# Patient Record
Sex: Female | Born: 1990
Health system: Southern US, Community
[De-identification: ages and names within clinical notes are randomized; demographics above are authoritative.]

## PROBLEM LIST (undated history)

## (undated) DIAGNOSIS — Z803 Family history of malignant neoplasm of breast: Secondary | ICD-10-CM

## (undated) DIAGNOSIS — C801 Malignant (primary) neoplasm, unspecified: Secondary | ICD-10-CM

## (undated) DIAGNOSIS — R51 Headache: Secondary | ICD-10-CM

## (undated) DIAGNOSIS — R112 Nausea with vomiting, unspecified: Secondary | ICD-10-CM

## (undated) DIAGNOSIS — Z808 Family history of malignant neoplasm of other organs or systems: Secondary | ICD-10-CM

## (undated) DIAGNOSIS — B009 Herpesviral infection, unspecified: Secondary | ICD-10-CM

## (undated) DIAGNOSIS — K219 Gastro-esophageal reflux disease without esophagitis: Secondary | ICD-10-CM

## (undated) DIAGNOSIS — E3122 Multiple endocrine neoplasia [MEN] type IIA: Secondary | ICD-10-CM

## (undated) DIAGNOSIS — Z9889 Other specified postprocedural states: Secondary | ICD-10-CM

## (undated) DIAGNOSIS — Z800 Family history of malignant neoplasm of digestive organs: Secondary | ICD-10-CM

## (undated) HISTORY — DX: Family history of malignant neoplasm of digestive organs: Z80.0

## (undated) HISTORY — DX: Family history of malignant neoplasm of breast: Z80.3

## (undated) HISTORY — DX: Family history of malignant neoplasm of other organs or systems: Z80.8

## (undated) HISTORY — DX: Multiple endocrine neoplasia (MEN) type IIA: E31.22

## (undated) HISTORY — PX: THYROIDECTOMY: SHX17

## (undated) HISTORY — PX: SHOULDER SURGERY: SHX246

---
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
## 2009-05-19 ENCOUNTER — Ambulatory Visit (HOSPITAL_COMMUNITY): Admission: RE | Admit: 2009-05-19 | Discharge: 2009-05-19 | Payer: Self-pay | Admitting: Orthopedic Surgery

## 2009-09-08 ENCOUNTER — Ambulatory Visit (HOSPITAL_BASED_OUTPATIENT_CLINIC_OR_DEPARTMENT_OTHER): Admission: RE | Admit: 2009-09-08 | Discharge: 2009-09-08 | Payer: Self-pay | Admitting: Orthopedic Surgery

## 2009-11-09 ENCOUNTER — Emergency Department (HOSPITAL_COMMUNITY): Admission: EM | Admit: 2009-11-09 | Discharge: 2009-11-09 | Payer: Self-pay | Admitting: Emergency Medicine

## 2010-04-01 ENCOUNTER — Emergency Department (HOSPITAL_COMMUNITY): Admission: EM | Admit: 2010-04-01 | Discharge: 2010-04-01 | Payer: Self-pay | Admitting: Family Medicine

## 2010-07-17 ENCOUNTER — Ambulatory Visit (HOSPITAL_COMMUNITY)
Admission: RE | Admit: 2010-07-17 | Discharge: 2010-07-17 | Payer: Self-pay | Source: Home / Self Care | Attending: Emergency Medicine | Admitting: Emergency Medicine

## 2010-09-19 LAB — URINALYSIS, ROUTINE W REFLEX MICROSCOPIC
Bilirubin Urine: NEGATIVE
Ketones, ur: 15 mg/dL — AB
pH: 6 (ref 5.0–8.0)

## 2010-09-19 LAB — URINE CULTURE
Colony Count: NO GROWTH
Culture: NO GROWTH

## 2010-09-19 LAB — GLUCOSE, CAPILLARY: Glucose-Capillary: 120 mg/dL — ABNORMAL HIGH (ref 70–99)

## 2010-09-19 LAB — URINE MICROSCOPIC-ADD ON

## 2010-11-17 NOTE — Op Note (Signed)
NAMETARAYA, STEWARD                 ACCOUNT NO.:  [id number]   MEDICAL RECORD NO.:  [id number]          PATIENT TYPE:  OIB   LOCATION:  6126                         FACILITY:  [facility]   PHYSICIAN:  Velora Heckler, MD      DATE OF BIRTH:  Apr 24, 1991   DATE OF PROCEDURE:  01/08/2006  DATE OF DISCHARGE:                                 OPERATIVE REPORT   PREOPERATIVE DIAGNOSIS:  Multiple endocrine neoplasia (MEN) 2A syndrome.   POSTOPERATIVE DIAGNOSIS:  Multiple endocrine neoplasia (MEN) 2A syndrome.   PROCEDURE:  1.  Total thyroidectomy.  2.  Limited central compartment lymph node dissection.   SURGEON:  Velora Heckler, MD, FACS   ASSISTANT:  Gita Kudo, MD, FACS   ANESTHESIA:  General per Dr. Kipp Brood.   ESTIMATED BLOOD LOSS:  Minimal.   PREPARATION:  Betadine.   COMPLICATIONS:  None.   INDICATIONS:  The patient is a 20 year old white female from [place], [place], who comes from a family genetically tested positive  for the MEN-2A syndrome.  Four other family members have undergone  thyroidectomy and been found to have medullary thyroid carcinoma.  The RET  proto-oncogene mutation is present in this patient.  Serum calcitonin level  is normal at 2.5.  The patient now comes for surgery for prophylactic  thyroidectomy.   The procedure is done in OR #3 at the [facility].  The  patient is brought to the operating room, placed in the supine position on  the operating room table.  Following administration of general anesthesia,  the patient is positioned and then prepped and draped in the usual strict  aseptic fashion.  After ascertaining that an adequate level of anesthesia  had been obtained, a Kocher incision was made with a #15 blade.  Dissection  is carried through subcutaneous tissues and platysma.  Hemostasis is  obtained with electrocautery.  Skin flaps are elevated cephalad and caudad.  A Mahorner self-retaining retractor is  placed for exposure.  Strap muscles  are incised in the midline and dissection is begun on the left side of the  neck.  Left thyroid lobe is exposed.  It appears grossly normal.  Venous  tributaries are divided between small ligaclips.  Superior pole vessels are  dissected out, ligated in continuity with 2-0 silk ties and medium ligaclips  and divided.  A nodule of tissue on the capsule of the gland near the  superior pole is identified.  It is excised.  It is biopsied and proves to  be parathyroid tissue.  This represents the left superior parathyroid gland.  The remaining tissue is reimplanted in the left sternocleidomastoid muscle  and secured with a 3-0 Vicryl suture ligature.   Continuing, the left thyroid lobe is mobilized.  Inferior venous tributaries  are divided between ligaclips.  Inferior thyroid artery is identified and  divided between ligaclips.  Recurrent laryngeal nerve is identified and  preserved.  Inferior parathyroid gland on the left is also identified and  preserved on its vascular pedicle.  Gland is rolled medially.  Ligament of  Allyson Sabal is transected with the electrocautery, and the gland is rolled up and  onto the anterior trachea.  There is a small pyramidal lobe which is  carefully dissected out with the electrocautery.   Next, we turned our attention to the right thyroid lobe.  Again, strap  muscles are reflected laterally.  Middle thyroid vein is divided between  small ligaclips.  Superior pole is dissected out.  Superior pole vessels are  ligated in continuity with 2-0 silk ties and medium ligaclips and divided.  Superior parathyroid gland is identified and preserved on its vascular  pedicle.  Inferior venous tributaries are divided between small ligaclips.  Inferior parathyroid gland is identified on the capsule of the thyroid and  gently dissected off on its vascular pedicle and preserved.  Inferior  thyroid artery is divided between ligaclips.  Recurrent  nerve is identified  and preserved.  Dissection is carried medially.  Thyroid gland is mobilized  off the trachea using the electrocautery for hemostasis.  Ligament of Allyson Sabal  is transected with the electrocautery and the gland is completely excised  off the anterior trachea.  A suture is used to mark the right superior pole  of the gland.  The entire gland is submitted to pathology for review.  Neck  is irrigated with warm saline.  Good hemostasis is obtained on both sides.   Next, the central compartment lymph nodes overlying the trachea are  dissected out using the electrocautery for hemostasis.  Several small lymph  nodes which appeared clinically benign are resected.  Dissection is carried  down to the level of the innominate vein.  Specimen is submitted to  pathology for review.  Good hemostasis is noted.  Surgicel is placed in the  central compartment.  Surgicel is placed over the recurrent nerves  bilaterally.  Strap muscles are reapproximated in the midline with  interrupted 3-0 Vicryl sutures.  Platysma is closed with interrupted 3-0  Vicryl sutures.  Skin is closed with a running 4-0 Vicryl subcuticular  suture.  Wound is washed and dried and Benzoin and Steri-Strips are applied.  Sterile dressings are applied.  The patient is awakened from anesthesia and  brought to the recovery room in stable condition.  The patient tolerated the  procedure well.      Velora Heckler, MD  Electronically Signed     TMG/MEDQ  D:  01/08/2006  T:  01/08/2006  Job:  [id number]   cc:   Dorisann Frames, M.D.  Fax: [phone]   Eliberto Ivory, M.D.  Fax: [phone]

## 2010-11-17 NOTE — Discharge Summary (Signed)
Nicole Farmer, Nicole Farmer                             ACCOUNT NO.:  [id number]   MEDICAL RECORD NO.:  [id number]                   PATIENT TYPE:  INP   LOCATION:  6148                                 FACILITY:  [facility]   PHYSICIAN:  Coletta Memos, M.D.                  DATE OF BIRTH:  September 01, 1990   DATE OF ADMISSION:  02/07/2004  DATE OF DISCHARGE:  02/08/2004                                 DISCHARGE SUMMARY   ADMISSION DIAGNOSIS:  Closed head injury.   INDICATIONS:  Nicole Farmer is a 20 year old who while at cheerleading  practice at the [place] fell hitting her head. She was confused and  not herself at the time of the injury. She was always alert. She did not  loose consciousness. There was no seizure activity. She was nauseated and  had three episodes of emesis prior to my seeing her in the emergency room in  the morning yesterday on February 07, 2004. Her neurological exam at the time  of admission was normal. Speech is clear and fluent. Pupils are equal,  round, and reactive to light. Full extraocular movements. At discharge, she  has normal neurological examination. A repeat head CT showed that there was  no evidence of a temporal lobe contusion which was questionable on the  initial scan. I told her to refrain from any activities with a strong  likelihood of head injuries such as climbing in her cheerleading practice  and tumbling for approximately two months. She did not need any follow up in  the office. Her mother is an emergency room nurse and knows to contact me as  we have discussed if there are problems.   DISCHARGE MEDICATIONS:  None.   DISCHARGE DESTINATION:  To home with parents. There have been no  complications during her admission.                                                Coletta Memos, M.D.    KC/MEDQ  D:  02/08/2004  T:  02/08/2004  Job:  [id number]

## 2010-11-17 NOTE — H&P (Signed)
[name], Nicole Farmer                             ACCOUNT NO.:  [id number]   MEDICAL RECORD NO.:  [id number]                   PATIENT TYPE:  EMS   LOCATION:  MAJO                                 FACILITY:  [facility]   PHYSICIAN:  Nicole Farmer, M.D.                  DATE OF BIRTH:  08-Mar-1991   DATE OF ADMISSION:  02/07/2004  DATE OF DISCHARGE:                                HISTORY & PHYSICAL   CHIEF COMPLAINT:  Headache, nausea and vomiting, possible temporal lobe  contusion on the right.   INDICATIONS:  The patient is the daughter of Nicole Farmer who is an emergency  room nurse here at [facility].  She while at cheerleading practice  this morning took a fall, striking her head on the floor covered with a mat  though her mother describes the mats as not being that thick.  She was very  confused initially and has also had three episodes of emesis since the fall.  According to her Mom, she has been quite goofy since hitting her head and on  occasion sounds more like her daughter.  She was always alert.  She was  always able to follow commands.  She is amnestic for the event, however.   PAST MEDICAL HISTORY:  Excellent.   REVIEW OF SYMPTOMS:  She denies constitutional, ear, nose, throat, mouth,  cardiovascular, respiratory, gastrointestinal, genito-urinary, skin,  neurological, hematologic, allergic, endocrinologic and psychiatric  problems.   MEDICATIONS:  She takes no medications.   ALLERGIES:  1. CODEINE.   IMMUNIZATIONS:  Her pediatric immunizations are current.   SOCIAL HISTORY:  She lives with her mother and father.  She does attend  school when school is in session.  She has had any previous head injuries.   PHYSICAL EXAMINATION:  VITAL SIGNS:  Blood pressure is 113/54, pulse is 81  when it was last recorded, respiratory rate 20, 100% on pulse oximetry  saturation on breathing air.  HEENT:  Pupils equal, round and reactive to light.  Extraocular movements,  oral and  funduscopic exams are present.  She has normal visual fields.  She  has symmetric facial movements and symmetric facial sensation. Hearing is  intact to voice bilaterally.  Uvula elevates in the midline.  Shoulder shrug  is normal. Tongue protrudes in midline.  LUNGS:  The lung fields are clear.  There are no cervical masses or bruits.  EXTREMITIES: Pulses are good at the wrists and feet bilaterally.  CARDIOVASCULAR:  Heart is regular rate and rhythm with no murmurs or rubs  recorded.  NEUROLOGIC:  She does have some dried emesis on the bed next to her.  She  was mildly sleepy, but easily woke up for her exam.  She has normal strength  in the upper and lower extremities.  There is no drift.  There is intact  proprioception and intact reflexes.  Gait was  not assessed.   CT of the head is reviewed. This shows what might possibly be less than 1 mm  punctate lesion which is high density in the temporal lobe.  I am not at all  sure that this is real, but it is there. Otherwise, there are no skull  fractures and no epidural, subdural or subarachnoid hemorrhoids.  The basal  cisterns are widely patent.  The ventricles are patent.  There is normal  gray white differentiation.  No mass is appreciated.   DIAGNOSIS:  Mild closed head injury, Glasgow coma scale of 15.   PLAN:  Secondary to the continued emesis, I believe it would be best that  the patient be admitted overnight on the pediatric unit.  I explained this  to both her mother and father and to the patient.  She appears to be doing  well.  She will have a repeat head CT for tomorrow and will be observed on  the pediatric floor.                                                Nicole Farmer, M.D.    KC/MEDQ  D:  02/07/2004  T:  02/07/2004  Job:  [id number]

## 2011-06-13 ENCOUNTER — Emergency Department (HOSPITAL_COMMUNITY): Payer: 59

## 2011-06-13 ENCOUNTER — Emergency Department (HOSPITAL_COMMUNITY)
Admission: EM | Admit: 2011-06-13 | Discharge: 2011-06-13 | Disposition: A | Discharge status: Home / Self Care | Payer: 59 | Attending: Emergency Medicine | Admitting: Emergency Medicine

## 2011-06-13 DIAGNOSIS — J029 Acute pharyngitis, unspecified: Secondary | ICD-10-CM | POA: Insufficient documentation

## 2011-06-13 DIAGNOSIS — R05 Cough: Secondary | ICD-10-CM | POA: Insufficient documentation

## 2011-06-13 DIAGNOSIS — R059 Cough, unspecified: Secondary | ICD-10-CM | POA: Insufficient documentation

## 2011-06-13 DIAGNOSIS — R509 Fever, unspecified: Secondary | ICD-10-CM | POA: Insufficient documentation

## 2011-06-13 LAB — RAPID STREP SCREEN (MED CTR MEBANE ONLY): Streptococcus, Group A Screen (Direct): NEGATIVE

## 2011-06-13 MED ORDER — HYDROCODONE-HOMATROPINE 5-1.5 MG/5ML PO SYRP: 5.0000 mL | ORAL_SOLUTION | Freq: Four times a day (QID) | ORAL | Status: AC | PRN

## 2011-06-13 NOTE — ED Provider Notes (Signed)
History     CSN: [id number] Arrival date & time: 06/13/2011 12:10 PM   First MD Initiated Contact with Patient 06/13/11 1240      Chief Complaint  Patient presents with  . Fever    (Consider location/radiation/quality/duration/timing/severity/associated sxs/prior treatment) HPI Comments: Patient was evaluated by her school physician and was given a prednisone pack 4 her sore throat.  Now she states she's having fever and cough as well.  She states she has had some night sweats and chills.  Denies other complaints.  Patient is a 20 y.o. female presenting with fever and pharyngitis. The history is provided by the patient.  Fever Primary symptoms of the febrile illness include fever, fatigue, cough and wheezing. Primary symptoms do not include visual change, headaches, shortness of breath, abdominal pain, nausea, vomiting, diarrhea or rash. The current episode started 3 to 5 days ago. This is a new problem. The problem has not changed since onset. The cough began 3 to 5 days ago. The cough is non-productive.  Sore Throat This is a new problem. The current episode started in the past 7 days. The problem occurs constantly. The problem has been unchanged. Associated symptoms include chills, coughing, fatigue, a fever and a sore throat. Pertinent negatives include no abdominal pain, chest pain, congestion, diaphoresis, headaches, nausea, neck pain, numbness, rash, urinary symptoms, visual change, vomiting or weakness. The symptoms are aggravated by eating and coughing. Treatments tried: steroids  The treatment provided no relief.  Sore Throat This is a new problem. The current episode started in the past 7 days. The problem occurs constantly. The problem has been unchanged. Pertinent negatives include no chest pain, no abdominal pain, no headaches and no shortness of breath. The symptoms are aggravated by eating and coughing. Treatments tried: steroids  The treatment provided no relief.    No  past medical history on file.  No past surgical history on file.  No family history on file.  History  Substance Use Topics  . Smoking status: Not on file  . Smokeless tobacco: Not on file  . Alcohol Use: Not on file    OB History    No data available      Review of Systems  Constitutional: Positive for fever, chills, appetite change and fatigue. Negative for diaphoresis and activity change.  HENT: Positive for sore throat. Negative for congestion, facial swelling, rhinorrhea, sneezing, drooling, mouth sores, trouble swallowing, neck pain, neck stiffness, dental problem, voice change, postnasal drip and sinus pressure.   Eyes: Negative for visual disturbance.  Respiratory: Positive for cough and wheezing. Negative for choking, shortness of breath and stridor.   Cardiovascular: Negative for chest pain.  Gastrointestinal: Negative for nausea, vomiting, abdominal pain and diarrhea.  Skin: Negative for rash.  Neurological: Negative for dizziness, weakness, numbness and headaches.  Hematological: Positive for adenopathy. Does not bruise/bleed easily.  All other systems reviewed and are negative.    Allergies  Codeine  Home Medications   Current Outpatient Rx  Name Route Sig Dispense Refill  . IBUPROFEN 200 MG PO TABS Oral Take 600 mg by mouth every 6 (six) hours as needed. For pain     . LEVOTHYROXINE SODIUM 200 MCG PO TABS Oral Take 200 mcg by mouth daily.      Marland Kitchen LOESTRIN 24 FE PO Oral Take 1 tablet by mouth daily.      Marland Kitchen OMEPRAZOLE 20 MG PO CPDR Oral Take 20 mg by mouth daily.      Marland Kitchen PREDNISONE 20 MG  PO TABS Oral Take 40 mg by mouth daily. For 5 days     . VALACYCLOVIR HCL 1 G PO TABS Oral Take 500 mg by mouth daily.        BP 127/64  Pulse 79  Temp 99.3 F (37.4 C)  Resp 20  SpO2 98%  Physical Exam  Constitutional: She is oriented to person, place, and time. She appears well-developed and well-nourished. No distress.  HENT:  Head: Normocephalic and atraumatic.  No trismus in the jaw.  Right Ear: Tympanic membrane, external ear and ear canal normal.  Left Ear: Tympanic membrane, external ear and ear canal normal.  Nose: Rhinorrhea present. No sinus tenderness or nasal deformity. Right sinus exhibits no maxillary sinus tenderness and no frontal sinus tenderness. Left sinus exhibits no maxillary sinus tenderness and no frontal sinus tenderness.  Mouth/Throat: Uvula is midline, oropharynx is clear and moist and mucous membranes are normal. Normal dentition. No dental abscesses or uvula swelling. No oropharyngeal exudate, posterior oropharyngeal edema, posterior oropharyngeal erythema or tonsillar abscesses.  Neck: Trachea normal, normal range of motion and full passive range of motion without pain. Neck supple. No rigidity. Normal range of motion present. No Brudzinski's sign noted.  Cardiovascular: Normal rate and regular rhythm.   Pulmonary/Chest: Effort normal and breath sounds normal.  Abdominal: Soft.  Musculoskeletal: Normal range of motion.  Lymphadenopathy:       Head (right side): No preauricular and no posterior auricular adenopathy present.       Head (left side): No preauricular and no posterior auricular adenopathy present.    She has no cervical adenopathy.  Neurological: She is alert and oriented to person, place, and time.  Skin: Skin is warm and dry. She is not diaphoretic.  Psychiatric: She has a normal mood and affect.    ED Course  Procedures (including critical care time)   Labs Reviewed  RAPID STREP SCREEN   Dg Chest 2 View  06/13/2011  *RADIOLOGY REPORT*  Clinical Data: Cough.  Sore throat.  Congestion.  Fever.  CHEST - 2 VIEW  Comparison: None.  Findings: Heart size is normal.  Mediastinal shadows are normal. Lungs are clear.  No effusions.  Minimal spinal curvature.  IMPRESSION: No active cardiopulmonary disease.  Minimal spinal curvature.  Original Report Authenticated By: Thomasenia Sales, M.D.     No diagnosis  found. Patient chest x-ray and rapid strep were both negative.  Patient likely has a viral syndrome as a cause for her upper respiratory tract infection.  Patient will be given Hycodan for both pain relief and cough suppressant and instructed to gargle salt water.  Patient is to followup with her primary care Dr.   MDM  Sore throat, fever, cough   Medical screening examination/treatment/procedure(s) were performed by non-physician practitioner and as supervising physician I was immediately available for consultation/collaboration. Osvaldo Human, M.D.     [place], [place] 06/13/11 1416  Carleene Cooper III, MD 06/13/11 [phone]

## 2011-06-13 NOTE — ED Notes (Signed)
No change in condition, awaiting lab results

## 2011-06-13 NOTE — ED Notes (Signed)
Patient here with increasing sorethroat, fever. [facility] and started on prednisone-now having dizziness, fever, rash to upper extremities.

## 2011-07-17 ENCOUNTER — Emergency Department (HOSPITAL_BASED_OUTPATIENT_CLINIC_OR_DEPARTMENT_OTHER)
Admission: EM | Admit: 2011-07-17 | Discharge: 2011-07-17 | Disposition: A | Discharge status: Home / Self Care | Payer: 59 | Attending: Emergency Medicine | Admitting: Emergency Medicine

## 2011-07-17 ENCOUNTER — Emergency Department (INDEPENDENT_AMBULATORY_CARE_PROVIDER_SITE_OTHER): Payer: 59

## 2011-07-17 ENCOUNTER — Encounter (HOSPITAL_BASED_OUTPATIENT_CLINIC_OR_DEPARTMENT_OTHER): Payer: Self-pay | Admitting: Emergency Medicine

## 2011-07-17 DIAGNOSIS — R110 Nausea: Secondary | ICD-10-CM | POA: Insufficient documentation

## 2011-07-17 DIAGNOSIS — R1031 Right lower quadrant pain: Secondary | ICD-10-CM

## 2011-07-17 LAB — DIFFERENTIAL
Basophils Absolute: 0 10*3/uL (ref 0.0–0.1)
Basophils Relative: 0 % (ref 0–1)
Eosinophils Relative: 1 % (ref 0–5)
Monocytes Absolute: 0.7 10*3/uL (ref 0.1–1.0)
Monocytes Relative: 9 % (ref 3–12)

## 2011-07-17 LAB — COMPREHENSIVE METABOLIC PANEL
AST: 15 U/L (ref 0–37)
Albumin: 4.2 g/dL (ref 3.5–5.2)
BUN: 16 mg/dL (ref 6–23)
CO2: 23 mEq/L (ref 19–32)
Calcium: 9.4 mg/dL (ref 8.4–10.5)
Creatinine, Ser: 1 mg/dL (ref 0.50–1.10)
GFR calc non Af Amer: 80 mL/min — ABNORMAL LOW (ref >90)
Total Bilirubin: 0.3 mg/dL (ref 0.3–1.2)

## 2011-07-17 LAB — URINALYSIS, ROUTINE W REFLEX MICROSCOPIC
Bilirubin Urine: NEGATIVE
Hgb urine dipstick: NEGATIVE
Ketones, ur: NEGATIVE mg/dL
Protein, ur: 100 mg/dL — AB
Urobilinogen, UA: 0.2 mg/dL (ref 0.0–1.0)

## 2011-07-17 LAB — URINE MICROSCOPIC-ADD ON

## 2011-07-17 LAB — CBC
HCT: 39.1 % (ref 36.0–46.0)
Hemoglobin: 13.2 g/dL (ref 12.0–15.0)
MCHC: 33.8 g/dL (ref 30.0–36.0)
MCV: 89.5 fL (ref 78.0–100.0)
RDW: 12.6 % (ref 11.5–15.5)

## 2011-07-17 LAB — LIPASE, BLOOD: Lipase: 54 U/L (ref 11–59)

## 2011-07-17 MED ORDER — ONDANSETRON 4 MG PO TBDP: 4.0000 mg | ORAL_TABLET | Freq: Three times a day (TID) | ORAL | Status: AC | PRN

## 2011-07-17 MED ORDER — IOHEXOL 300 MG/ML  SOLN
100.0000 mL | Freq: Once | INTRAMUSCULAR | Status: AC | PRN
  Administered 2011-07-17: 100 mL via INTRAVENOUS

## 2011-07-17 MED ORDER — IOHEXOL 300 MG/ML  SOLN
20.0000 mL | Freq: Once | INTRAMUSCULAR | Status: AC | PRN
  Administered 2011-07-17: 20 mL via ORAL

## 2011-07-17 MED ORDER — HYDROCODONE-ACETAMINOPHEN 5-500 MG PO TABS
1.0000 | ORAL_TABLET | Freq: Four times a day (QID) | ORAL | Status: AC | PRN
Start: 2011-07-17 — End: 2011-07-27

## 2011-07-17 MED ORDER — AMOXICILLIN-POT CLAVULANATE 875-125 MG PO TABS: 1.0000 | ORAL_TABLET | Freq: Two times a day (BID) | ORAL | Status: DC

## 2011-07-17 NOTE — ED Notes (Signed)
Pt c/o RLQ pain x 3-4 days; some nausea today; denies vomiting/diarrhea

## 2011-07-17 NOTE — ED Provider Notes (Signed)
History     CSN: [id number]  Arrival date & time 07/17/11  1407   First MD Initiated Contact with Patient 07/17/11 1410      Chief Complaint  Patient presents with  . Abdominal Pain    (Consider location/radiation/quality/duration/timing/severity/associated sxs/prior treatment) HPI Comments: Pt states that she has had a low grade temperature  Patient is a 21 y.o. female presenting with abdominal pain. The history is provided by the patient. No language interpreter was used.  Abdominal Pain The primary symptoms of the illness include abdominal pain and nausea. The primary symptoms of the illness do not include vomiting, diarrhea, vaginal discharge or vaginal bleeding. The current episode started more than 2 days ago. The onset of the illness was gradual. The problem has not changed since onset. The patient states that she believes she is currently not pregnant. The patient has not had a change in bowel habit. Symptoms associated with the illness do not include chills, constipation, urgency, frequency or back pain.    History reviewed. No pertinent past medical history.  Past Surgical History  Procedure Date  . Thyroidectomy   . Shoulder surgery     RT    No family history on file.  History  Substance Use Topics  . Smoking status: Never Smoker   . Smokeless tobacco: Not on file  . Alcohol Use: Yes     social    OB History    Grav Para Term Preterm Abortions TAB SAB Ect Mult Living                  Review of Systems  Constitutional: Negative for chills.  Gastrointestinal: Positive for nausea and abdominal pain. Negative for vomiting, diarrhea and constipation.  Genitourinary: Negative for urgency, frequency, vaginal bleeding and vaginal discharge.  Musculoskeletal: Negative for back pain.  All other systems reviewed and are negative.    Allergies  Codeine  Home Medications   Current Outpatient Rx  Name Route Sig Dispense Refill  . IBUPROFEN 200 MG PO TABS  Oral Take 600 mg by mouth every 6 (six) hours as needed. For pain     . LEVOTHYROXINE SODIUM 200 MCG PO TABS Oral Take 200 mcg by mouth daily.      Marland Kitchen LOESTRIN 24 FE PO Oral Take 1 tablet by mouth daily.      Marland Kitchen OMEPRAZOLE 20 MG PO CPDR Oral Take 20 mg by mouth daily.      Marland Kitchen VALACYCLOVIR HCL 1 G PO TABS Oral Take 500 mg by mouth daily.        BP 121/79  Pulse 75  Temp(Src) 97.5 F (36.4 C) (Oral)  Resp 16  Wt 131 lb (59.421 kg)  SpO2 100%  LMP 06/16/2011  Physical Exam  Nursing note and vitals reviewed. Constitutional: She is oriented to person, place, and time. She appears well-developed and well-nourished.  HENT:  Head: Normocephalic and atraumatic.  Cardiovascular: Normal rate and regular rhythm.   Pulmonary/Chest: Effort normal and breath sounds normal.  Abdominal: Soft. Bowel sounds are normal. There is tenderness in the right lower quadrant.  Musculoskeletal: Normal range of motion.  Neurological: She is alert and oriented to person, place, and time.  Skin: Skin is warm and dry.  Psychiatric: She has a normal mood and affect.    ED Course  Procedures (including critical care time)  Labs Reviewed  URINALYSIS, ROUTINE W REFLEX MICROSCOPIC - Abnormal; Notable for the following:    Color, Urine AMBER (*) BIOCHEMICALS MAY BE  AFFECTED BY COLOR   Specific Gravity, Urine 1.034 (*)    Protein, ur 100 (*)    All other components within normal limits  COMPREHENSIVE METABOLIC PANEL - Abnormal; Notable for the following:    GFR calc non Af Amer 80 (*)    All other components within normal limits  PREGNANCY, URINE  CBC  DIFFERENTIAL  LIPASE, BLOOD  URINE MICROSCOPIC-ADD ON   Ct Abdomen Pelvis W Contrast  07/17/2011  *RADIOLOGY REPORT*  Clinical Data: Right lower quadrant abdominal pain.  Nausea.  CT ABDOMEN AND PELVIS WITH CONTRAST  Technique:  Multidetector CT imaging of the abdomen and pelvis was performed following the standard protocol during bolus administration of  intravenous contrast.  Contrast:  100  ml Omnipaque 300 IV contrast  Comparison: 07/17/2010  Findings: Lung bases are clear.  Liver, gallbladder, adrenal glands, kidneys, spleen, and pancreas are normal.  No lymphadenopathy.  No free air or free fluid.  Although the appendix is normal in caliber measuring 6 mm, there is minimal indistinctness of the wall and minimal apparent haziness of the surrounding fat.  No surrounding periappendiceal fluid collection or gas is identified.  No bowel wall thickening or focal segmental dilatation.  Uterus and ovaries are normal.  Trace pelvic free fluid is present.  No acute osseous finding.  IMPRESSION: Normal caliber appendix but with minimal indistinctness of the wall and surrounding haziness in the mesenteric fat, which could indicate very early appendicitis in the appropriate clinical context but is nonspecific.  No other acute intra-abdominal or pelvic pathology.  Original Report Authenticated By: Harrel Lemon, M.D.     1. Abdominal pain       MDM  Discussed the case with Dr. Magnus Ivan and we agreed that reasonable treatment would be antibiotics and follow up in the office or ed for continued or worsening symptoms        Teressa Lower, NP 07/17/11 1621

## 2011-07-18 NOTE — ED Provider Notes (Signed)
Medical screening examination/treatment/procedure(s) were performed by non-physician practitioner and as supervising physician I was immediately available for consultation/collaboration.   Manila Rommel A Catrina Fellenz, MD 07/18/11 0701 

## 2011-07-19 ENCOUNTER — Emergency Department (HOSPITAL_BASED_OUTPATIENT_CLINIC_OR_DEPARTMENT_OTHER)
Admission: EM | Admit: 2011-07-19 | Discharge: 2011-07-19 | Disposition: A | Discharge status: Home / Self Care | Payer: 59 | Attending: Emergency Medicine | Admitting: Emergency Medicine

## 2011-07-19 ENCOUNTER — Telehealth (INDEPENDENT_AMBULATORY_CARE_PROVIDER_SITE_OTHER): Payer: Self-pay

## 2011-07-19 ENCOUNTER — Emergency Department (INDEPENDENT_AMBULATORY_CARE_PROVIDER_SITE_OTHER): Payer: 59

## 2011-07-19 ENCOUNTER — Encounter (HOSPITAL_BASED_OUTPATIENT_CLINIC_OR_DEPARTMENT_OTHER): Payer: Self-pay

## 2011-07-19 DIAGNOSIS — N730 Acute parametritis and pelvic cellulitis: Secondary | ICD-10-CM

## 2011-07-19 DIAGNOSIS — Z79899 Other long term (current) drug therapy: Secondary | ICD-10-CM | POA: Insufficient documentation

## 2011-07-19 DIAGNOSIS — R1031 Right lower quadrant pain: Secondary | ICD-10-CM | POA: Insufficient documentation

## 2011-07-19 LAB — CBC
MCH: 29.9 pg (ref 26.0–34.0)
MCHC: 33.5 g/dL (ref 30.0–36.0)
MCV: 89.3 fL (ref 78.0–100.0)
Platelets: 205 10*3/uL (ref 150–400)
RBC: 4.41 MIL/uL (ref 3.87–5.11)
RDW: 12.5 % (ref 11.5–15.5)

## 2011-07-19 LAB — URINALYSIS, ROUTINE W REFLEX MICROSCOPIC
Glucose, UA: NEGATIVE mg/dL
Ketones, ur: NEGATIVE mg/dL
Leukocytes, UA: NEGATIVE
Nitrite: NEGATIVE
Protein, ur: NEGATIVE mg/dL
Urobilinogen, UA: 0.2 mg/dL (ref 0.0–1.0)

## 2011-07-19 LAB — DIFFERENTIAL
Basophils Absolute: 0 10*3/uL (ref 0.0–0.1)
Basophils Relative: 1 % (ref 0–1)
Eosinophils Absolute: 0.1 10*3/uL (ref 0.0–0.7)
Eosinophils Relative: 2 % (ref 0–5)
Lymphs Abs: 2.7 10*3/uL (ref 0.7–4.0)
Neutrophils Relative %: 41 % — ABNORMAL LOW (ref 43–77)

## 2011-07-19 LAB — URINE CULTURE: Colony Count: NO GROWTH

## 2011-07-19 LAB — PREGNANCY, URINE: Preg Test, Ur: NEGATIVE

## 2011-07-19 MED ORDER — LIDOCAINE HCL (PF) 1 % IJ SOLN
INTRAMUSCULAR | Status: AC
  Administered 2011-07-19: 22:00:00
  Filled 2011-07-19: qty 5

## 2011-07-19 MED ORDER — MORPHINE SULFATE 4 MG/ML IJ SOLN
4.0000 mg | Freq: Once | INTRAMUSCULAR | Status: AC
  Administered 2011-07-19: 4 mg via INTRAVENOUS
  Filled 2011-07-19: qty 1

## 2011-07-19 MED ORDER — DIPHENHYDRAMINE HCL 25 MG PO CAPS
ORAL_CAPSULE | ORAL | Status: AC
  Administered 2011-07-19: 22:00:00
  Filled 2011-07-19: qty 2

## 2011-07-19 MED ORDER — DOXYCYCLINE HYCLATE 100 MG PO TABS
ORAL_TABLET | ORAL | Status: AC
  Administered 2011-07-19: 100 mg
  Filled 2011-07-19: qty 1

## 2011-07-19 MED ORDER — ONDANSETRON HCL 4 MG/2ML IJ SOLN
4.0000 mg | Freq: Once | INTRAMUSCULAR | Status: AC
  Administered 2011-07-19: 4 mg via INTRAVENOUS
  Filled 2011-07-19: qty 2

## 2011-07-19 MED ORDER — CEFTRIAXONE SODIUM 250 MG IJ SOLR
250.0000 mg | Freq: Once | INTRAMUSCULAR | Status: AC
  Administered 2011-07-19: 250 mg via INTRAMUSCULAR
  Filled 2011-07-19: qty 250

## 2011-07-19 MED ORDER — DOXYCYCLINE HYCLATE 100 MG PO CAPS: 100.0000 mg | ORAL_CAPSULE | Freq: Two times a day (BID) | ORAL | Status: AC

## 2011-07-19 MED ORDER — MORPHINE SULFATE 2 MG/ML IJ SOLN
INTRAMUSCULAR | Status: AC
  Administered 2011-07-19: 2 mg via INTRAVENOUS
  Filled 2011-07-19: qty 1

## 2011-07-19 NOTE — ED Provider Notes (Addendum)
History     CSN: [id number]  Arrival date & time 07/19/11  1740   First MD Initiated Contact with Patient 07/19/11 1802      Chief Complaint  Patient presents with  . Abdominal Pain    (Consider location/radiation/quality/duration/timing/severity/associated sxs/prior treatment) Patient is a 21 y.o. female presenting with abdominal pain. The history is provided by the patient and a parent.  Abdominal Pain The primary symptoms of the illness include abdominal pain.   status with right lower quadrant pain x1 week. Seen in the ER for similar symptoms and had a CAT scan which showed possible early appendicitis. Was placed on Augmentin for this. Symptoms have continued low-grade temperature noted. No of vaginal bleeding or discharge. No urinary symptoms of dysuria or flank pain. Pain worse with movement made better when taking pain medication. History of similar episodes a year ago and CT of the abdomen at that time was negative.  History reviewed. No pertinent past medical history.  Past Surgical History  Procedure Date  . Thyroidectomy   . Shoulder surgery     RT    No family history on file.  History  Substance Use Topics  . Smoking status: Never Smoker   . Smokeless tobacco: Not on file  . Alcohol Use: Yes     social    OB History    Grav Para Term Preterm Abortions TAB SAB Ect Mult Living                  Review of Systems  Gastrointestinal: Positive for abdominal pain.  All other systems reviewed and are negative.    Allergies  Codeine and Contrast media  Home Medications   Current Outpatient Rx  Name Route Sig Dispense Refill  . AMOXICILLIN-POT CLAVULANATE 875-125 MG PO TABS Oral Take 1 tablet by mouth every 12 (twelve) hours. For 7 days starting on 07/17/11-07/24/11    . HYDROCODONE-ACETAMINOPHEN 5-500 MG PO TABS Oral Take 1-2 tablets by mouth every 6 (six) hours as needed for pain. 10 tablet 0  . IBUPROFEN 200 MG PO TABS Oral Take 600 mg by mouth every 6  (six) hours as needed. For pain     . LEVOTHYROXINE SODIUM 200 MCG PO TABS Oral Take 200 mcg by mouth daily.      Marland Kitchen LOESTRIN 24 FE PO Oral Take 1 tablet by mouth daily.      Marland Kitchen OMEPRAZOLE 20 MG PO CPDR Oral Take 20 mg by mouth daily.      Marland Kitchen ONDANSETRON 4 MG PO TBDP Oral Take 1 tablet (4 mg total) by mouth every 8 (eight) hours as needed for nausea. 20 tablet 0  . VALACYCLOVIR HCL 1 G PO TABS Oral Take 500 mg by mouth daily.        BP 124/73  Pulse 73  Temp(Src) 98.2 F (36.8 C) (Oral)  Resp 16  Ht 5\' 8"  (1.727 m)  Wt 131 lb (59.421 kg)  BMI 19.92 kg/m2  SpO2 100%  LMP 06/16/2011  Physical Exam  Nursing note and vitals reviewed. Constitutional: She is oriented to person, place, and time. She appears well-developed and well-nourished.  Non-toxic appearance. No distress.  HENT:  Head: Normocephalic and atraumatic.  Eyes: Conjunctivae, EOM and lids are normal. Pupils are equal, round, and reactive to light.  Neck: Normal range of motion. Neck supple. No tracheal deviation present. No mass present.  Cardiovascular: Normal rate, regular rhythm and normal heart sounds.  Exam reveals no gallop.   No murmur  heard. Pulmonary/Chest: Effort normal and breath sounds normal. No stridor. No respiratory distress. She has no decreased breath sounds. She has no wheezes. She has no rhonchi. She has no rales.  Abdominal: Soft. Normal appearance and bowel sounds are normal. She exhibits no distension. There is tenderness in the right lower quadrant. There is no rigidity, no rebound, no guarding and no CVA tenderness.    Musculoskeletal: Normal range of motion. She exhibits no edema and no tenderness.  Neurological: She is alert and oriented to person, place, and time. She has normal strength. No cranial nerve deficit or sensory deficit. GCS eye subscore is 4. GCS verbal subscore is 5. GCS motor subscore is 6.  Skin: Skin is warm and dry. No abrasion and no rash noted.  Psychiatric: She has a normal mood  and affect. Her speech is normal and behavior is normal.    ED Course  Procedures (including critical care time)   Labs Reviewed  CBC  DIFFERENTIAL  URINALYSIS, ROUTINE W REFLEX MICROSCOPIC  URINE CULTURE   No results found.   No diagnosis found.    MDM  Patient given IV fluids and pain medication here. CT scan results reviewed and per radiology's suspicious for PID. Patient be given Rocephin and doxycycline. She was instructed to followup with her GYN Dr. to have further STD testing.  Pt has deferred her pelvic exam, denies vag discharge, prefers to have it done by her gyn        Toy Baker, MD 07/19/11 2107  Toy Baker, MD 07/19/11 2113

## 2011-07-19 NOTE — ED Notes (Signed)
Up to BR tolerated well 

## 2011-07-19 NOTE — ED Notes (Signed)
Pt reports abdominal pain x 1 week and worsening today.  She was seen in ED 2 days ago for similar symptoms.

## 2011-07-19 NOTE — Telephone Encounter (Addendum)
Chester Flegel's mother called seeking an appointment for her daughter. I tried to schedule the patient with the two providers ASAP that she spoke of.  However the next available appointments where next  Monday and Tuesday.  The mother then ended the telephone conversation by stating she was receiving a message from one of the providers.  I then told her maybe that provider can speak with his nurses to get her daughter work in sooner since she has personal contact with them.   Patient taking Augmentin, Hydrocodone, Zofran

## 2011-07-20 ENCOUNTER — Ambulatory Visit (INDEPENDENT_AMBULATORY_CARE_PROVIDER_SITE_OTHER): Payer: Self-pay | Admitting: General Surgery

## 2011-07-23 ENCOUNTER — Ambulatory Visit (INDEPENDENT_AMBULATORY_CARE_PROVIDER_SITE_OTHER): Payer: Self-pay | Admitting: General Surgery

## 2011-11-19 ENCOUNTER — Encounter (HOSPITAL_COMMUNITY): Payer: Self-pay | Admitting: *Deleted

## 2011-11-19 ENCOUNTER — Encounter (HOSPITAL_COMMUNITY)
Admission: RE | Disposition: A | Discharge status: Home / Self Care | Payer: Self-pay | Source: Ambulatory Visit | Attending: Gastroenterology

## 2011-11-19 ENCOUNTER — Ambulatory Visit (HOSPITAL_COMMUNITY)
Admission: RE | Admit: 2011-11-19 | Discharge: 2011-11-19 | Disposition: A | Discharge status: Home / Self Care | Payer: 59 | Source: Ambulatory Visit | Attending: Gastroenterology | Admitting: Gastroenterology

## 2011-11-19 DIAGNOSIS — Z79899 Other long term (current) drug therapy: Secondary | ICD-10-CM | POA: Insufficient documentation

## 2011-11-19 DIAGNOSIS — K219 Gastro-esophageal reflux disease without esophagitis: Secondary | ICD-10-CM | POA: Insufficient documentation

## 2011-11-19 DIAGNOSIS — R1013 Epigastric pain: Secondary | ICD-10-CM | POA: Insufficient documentation

## 2011-11-19 DIAGNOSIS — K449 Diaphragmatic hernia without obstruction or gangrene: Secondary | ICD-10-CM | POA: Insufficient documentation

## 2011-11-19 HISTORY — DX: Other specified postprocedural states: Z98.890

## 2011-11-19 HISTORY — PX: ESOPHAGOGASTRODUODENOSCOPY: SHX5428

## 2011-11-19 HISTORY — DX: Nausea with vomiting, unspecified: R11.2

## 2011-11-19 HISTORY — DX: Herpesviral infection, unspecified: B00.9

## 2011-11-19 HISTORY — DX: Headache: R51

## 2011-11-19 HISTORY — DX: Gastro-esophageal reflux disease without esophagitis: K21.9

## 2011-11-19 HISTORY — DX: Malignant (primary) neoplasm, unspecified: C80.1

## 2011-11-19 SURGERY — EGD (ESOPHAGOGASTRODUODENOSCOPY)
Anesthesia: Moderate Sedation

## 2011-11-19 MED ORDER — ONDANSETRON HCL 4 MG/2ML IJ SOLN
INTRAMUSCULAR | Status: DC | PRN
  Administered 2011-11-19: 4 mg via INTRAVENOUS

## 2011-11-19 MED ORDER — FENTANYL CITRATE 0.05 MG/ML IJ SOLN
INTRAMUSCULAR | Status: AC
  Filled 2011-11-19: qty 4

## 2011-11-19 MED ORDER — SODIUM CHLORIDE 0.9 % IV SOLN
Freq: Once | INTRAVENOUS | Status: AC
  Administered 2011-11-19: 16:00:00 via INTRAVENOUS

## 2011-11-19 MED ORDER — ONDANSETRON HCL 4 MG/2ML IJ SOLN
INTRAMUSCULAR | Status: AC
  Filled 2011-11-19: qty 2

## 2011-11-19 MED ORDER — DIPHENHYDRAMINE HCL 50 MG/ML IJ SOLN
INTRAMUSCULAR | Status: AC
  Filled 2011-11-19: qty 1

## 2011-11-19 MED ORDER — FENTANYL NICU IV SYRINGE 50 MCG/ML
INJECTION | INTRAMUSCULAR | Status: DC | PRN
  Administered 2011-11-19 (×3): 25 ug via INTRAVENOUS

## 2011-11-19 MED ORDER — MIDAZOLAM HCL 10 MG/2ML IJ SOLN
INTRAMUSCULAR | Status: AC
  Filled 2011-11-19: qty 4

## 2011-11-19 MED ORDER — LIDOCAINE VISCOUS 2 % MT SOLN
OROMUCOSAL | Status: AC
  Filled 2011-11-19: qty 30

## 2011-11-19 MED ORDER — MIDAZOLAM HCL 10 MG/2ML IJ SOLN
INTRAMUSCULAR | Status: DC | PRN
  Administered 2011-11-19 (×3): 2.5 mg via INTRAVENOUS

## 2011-11-19 MED ORDER — LIDOCAINE VISCOUS 2 % MT SOLN
OROMUCOSAL | Status: DC | PRN
  Administered 2011-11-19: 2 via OROMUCOSAL

## 2011-11-19 NOTE — Op Note (Signed)
[facility] [address] [place], [place]  [address]  OPERATIVE PROCEDURE REPORT  PATIENT:  Nicole Farmer, Nicole Farmer  MR#:  [id number] BIRTHDATE:  1991-03-03  GENDER:  female ENDOSCOPIST:  Dr. Lorenza Burton, MD ASSISTANT:  Beryle Beams, technician and Cathlean Marseilles, RN, [facility].  PROCEDURE DATE:  11/19/2011 PRE-PROCEDURE PREPERATION:  Patient fasted for 4 hours prior to procedure. PRE-PROCEDURE PHYSICAL:  Patient has stable vital signs. Neck is supple. There is no JVD, thyromegaly or LAD. Chest clear to auscultation. S1 and S2 regular. Abdomen soft, non-distended, non-tender with NABS. PROCEDURE:  EGD with small bowel biopsies. ASA CLASS:  Class II INDICATIONS:  1) Epigastric pain 2) Acid reflux 3) Family history of celiac sprue. MEDICATIONS:  Fentanyl 75 mcg, Versed 7.5 mg & zOFRAN 4 M iv. TOPICAL ANESTHETIC:  Viscous xylocaine-10 cc PO.  DESCRIPTION OF PROCEDURE: After the risks benefits and alternatives of the procedure were thoroughly explained, informed consent was obtained. The Pentax video gastroscope [id number] was introduced through the mouth and advanced to the second portion of the duodenum, without limitations. The instrument was slowly withdrawn as the mucosa was fully examined. <<PROCEDUREIMAGES>>  The esophagus, GEJ and the stomach appeared normal. There were no ulcers, erosions, masses or polyps noted. Retroflexed views revealed a small hiatal hernia. Villous blunting was noted in the proximal small bowel and multiple cold biopsies were done to rule out sprue. The scope was then withdrawn from the patient and the procedure terminated. The patient tolerated the procedure without immediate complications.  IMPRESSION:  1) Normal appearing esophagus and GEJ. 2) Small hiatal hernia. 3) Normal appearing stomach. 4) Villous blunting-proximal small bowel-bopsied to rule out sprue.  RECOMMENDATIONS:  1) Anti-reflux regimen to be followed. 2) Avoid NSAIDS for now. 3)  Await pathology results. 4) OP follow-up in 2 weeks.  REPEAT EXAM:  None planned for now.  DISCHARGE INSTRUCTIONS: Standard discharge instructions given.  ______________________________ Dr. Lorenza Burton, MD  CPT CODES: 19147  DIAGNOSIS CODES: 789.06, 530.81 V18.59  CC:  Dorisann Frames, M.D.  n. eSIGNED:   Dr. Lorenza Burton at 11/19/2011 04:36 PM  [place], [place], [phone]

## 2011-11-19 NOTE — H&P (Signed)
Nicole Farmer is an 21 y.o. female.   Chief Complaint:  Abdominal pain.  HPI: Nicole Farmer is here for an EGD. She has had worsening abdominal pain and reflux. She admits taking NSAIDS for headaches.   Past Medical History  Diagnosis Date  . Herpes infection     simplex 1  . GERD (gastroesophageal reflux disease)   . Headache   . Cancer     thyroid s/p thyroidectomy  . PONV (postoperative nausea and vomiting)     Past Surgical History  Procedure Date  . Thyroidectomy   . Shoulder surgery     RT   History reviewed. No pertinent family history. Social History:  reports that she has never smoked. She does not have any smokeless tobacco history on file. She reports that she drinks alcohol. She reports that she does not use illicit drugs.  Allergies:  Allergies  Allergen Reactions  . Codeine Hives  . Contrast Media (Iodinated Diagnostic Agents) Rash and Other (See Comments)    Loss of peripheral vision    Medications Prior to Admission  Medication Sig Dispense Refill  . levothyroxine (SYNTHROID, LEVOTHROID) 200 MCG tablet Take 200 mcg by mouth daily.        . norethindrone-ethinyl estradiol (MICROGESTIN,JUNEL,LOESTRIN) 1-20 MG-MCG tablet Take 1 tablet by mouth daily.      . valACYclovir (VALTREX) 1000 MG tablet Take 500 mg by mouth daily.        Marland Kitchen ibuprofen (ADVIL,MOTRIN) 200 MG tablet Take 600 mg by mouth every 6 (six) hours as needed. For pain       . Norethin Ace-Eth Estrad-FE (LOESTRIN 24 FE PO) Take 1 tablet by mouth daily.        Marland Kitchen omeprazole (PRILOSEC) 20 MG capsule Take 20 mg by mouth daily.          No results found for this or any previous visit (from the past 48 hour(s)). No results found.  Review of Systems  Constitutional: Negative.   HENT: Negative.   Eyes: Negative.   Cardiovascular: Negative.   Gastrointestinal: Positive for heartburn and abdominal pain. Negative for nausea, vomiting, diarrhea and constipation.  Genitourinary: Negative.   Musculoskeletal:  Negative.   Skin: Negative.   Neurological: Negative for dizziness, tingling, tremors, sensory change, speech change, focal weakness and seizures.  Psychiatric/Behavioral: Negative.     Blood pressure 125/72, temperature 98.4 F (36.9 C), temperature source Oral, resp. rate 21, height 5\' 9"  (1.753 m), weight 60.328 kg (133 lb), last menstrual period 11/09/2011, SpO2 100.00%. Physical Exam  Constitutional: She is oriented to person, place, and time. She appears well-developed and well-nourished.  HENT:  Head: Normocephalic and atraumatic.  Eyes: Conjunctivae and EOM are normal. Pupils are equal, round, and reactive to light.  Neck: Normal range of motion. Neck supple.  Cardiovascular: Normal rate and regular rhythm.   Respiratory: Effort normal and breath sounds normal.  GI: Soft. Bowel sounds are normal. She exhibits no mass. There is no tenderness. There is no rebound and no guarding.  Musculoskeletal: Normal range of motion.  Neurological: She is alert and oriented to person, place, and time. She has normal reflexes.  Skin: Skin is warm and dry.  Psychiatric: She has a normal mood and affect. Her behavior is normal. Judgment and thought content normal.     Assessment/Plan Worsening epigastric pain and acid reflux: Proceed with a colonoscopy an EGD at this time.  Nicole Farmer 11/19/2011, 4:02 PM

## 2011-11-19 NOTE — Discharge Instructions (Addendum)
Endoscopy Care After Please read the instructions outlined below and refer to this sheet in the next few weeks. These discharge instructions provide you with general information on caring for yourself after you leave the hospital. Your doctor may also give you specific instructions. While your treatment has been planned according to the most current medical practices available, unavoidable complications occasionally occur. If you have any problems or questions after discharge, please call your doctor. HOME CARE INSTRUCTIONS Activity  You may resume your regular activity but move at a slower pace for the next 24 hours.   Take frequent rest periods for the next 24 hours.   Walking will help expel (get rid of) the air and reduce the bloated feeling in your abdomen.   No driving for 24 hours (because of the anesthesia (medicine) used during the test).   You may shower.   Do not sign any important legal documents or operate any machinery for 24 hours (because of the anesthesia used during the test).  Nutrition  Drink plenty of fluids.   You may resume your normal diet.   Begin with a light meal and progress to your normal diet.   Avoid alcoholic beverages for 24 hours or as instructed by your caregiver.  Medications You may resume your normal medications unless your caregiver tells you otherwise. What you can expect today  You may experience abdominal discomfort such as a feeling of fullness or "gas" pains.   You may experience a sore throat for 2 to 3 days. This is normal. Gargling with salt water may help this.  Follow-up Your doctor will discuss the results of your test with you. SEEK IMMEDIATE MEDICAL CARE IF:  You have excessive nausea (feeling sick to your stomach) and/or vomiting.   You have severe abdominal pain and distention (swelling).   You have trouble swallowing.   You have a temperature over 100 F (37.8 C).   You have rectal bleeding or vomiting of blood.   Document Released: 01/31/2004 Document Revised: 06/07/2011 Document Reviewed: 08/13/2007 [facility] Patient Information 2012 [place], [place].  Celiac Disease Celiac disease is a digestive disease that causes your body's natural defense system (immune system) to react against its own cells. It interferes with taking in (absorbing) nutrients from food. Celiac disease is also known as celiac sprue, nontropical sprue, and gluten-sensitive enteropathy. People who have celiac disease cannot tolerate gluten. Gluten is a protein found in wheat, rye, and barley. With time, celiac disease will damage the cells lining the small intestine. This leads to being unable to absorb nutrients from food (malabsorption), diarrhea, and nutritional problems. CAUSES  Celiac disease is genetic. This means you have a higher likelihood of getting the disease if someone in your family has or has had it. Up to 10% of your close relatives (parent, sibling, child) may also have the disease.  People with celiac disease tend to have other autoimmune diseases. The link may be genetic. These diseases include:  Dermatitis herpetiformis.   Thyroid disease.   Systemic lupus erythematosis.   Type 1 diabetes.   Liver disease.   Collagen vascular disease.   Rheumatoid arthritis.   Sjgren's syndrome.  SYMPTOMS  The symptoms of celiac disease vary from person to person. The symptoms are generally digestive or nutritional. Digestive symptoms include:  Recurring belly (abdominal) bloating and pain.   Gas.   Long-term (chronic) diarrhea.   Pale, bad-smelling, greasy, or oily stool.  Nutritional symptoms include:  Failure to thrive in infants.   Delayed  growth in children.   Weight loss in children and adults.   Missed menstrual periods (often due to extreme weight loss).   Anemia.   Weakening bones (osteoporosis).   Fatigue and weakness.   Tingling or other signs of nerve damage (peripheral neuropathy).    Depression.  DIAGNOSIS  If your symptoms and physical exam suggest that a digestive disorder or malnutrition is present, your caregiver may suspect celiac disease. You may have already begun a gluten-free diet. If symptoms persist, testing may be needed to confirm the diagnosis. Some tests are best done while you are on a normal, unrestricted diet. Tests may include:  Blood tests to check for nutritional deficiencies.   Blood tests to look for evidence that the body is producing antibodies against its own small intestine cells.   Taking a tissue sample (biopsy) from the small bowel for evaluation.   X-rays of the small bowel.   Evaluating the stool for fat.   Tests to check for nutrient absorption from the intestine.  TREATMENT  It is important to seek treatment. Untreated celiac disease can cause growth problems (in children), anemia, osteoporosis, and possible nerve problems. A pregnant patient with untreated celiac disease has a higher risk of miscarriage, and the fetus has an increased risk of low birth weight and other growth problems. If celiac disease is diagnosed in the early stages, treatment can allow you to live a long, nearly symptom-free life. Treatment includes following a gluten-free diet. This means avoiding all foods that contain gluten. Eating even a small amount of gluten can damage your intestine. For most people, following this diet will stop symptoms. It will heal existing intestinal damage and prevent further damage. Improvements begin within days of starting the diet. The small intestine is usually completely healed within 3 to 6 months, or it may take up to 2 years for older adults. A small percentage of people do not improve on the gluten-free diet. Depending on your age and the stage at which you were diagnosed, some problems such as delayed growth and discolored teeth may not improve. Sometimes, damaged intestines cannot heal. If your intestines are not absorbing  enough nutrients, you may need to receive nutrition supplements through an intravenous (IV) tube. Drug treatments are being tested for unresponsive celiac disease. In this case, you may need to be evaluated for complications of the disease. Your caregiver may also recommend:  A pneumonia vaccination.   Nutrients and other treatments for any nutritional deficiencies.  Your caregiver can provide you with more information on a gluten-free diet. Discussion with a dietician skilled in this illness will be valuable. Support groups may also be helpful. HOME CARE INSTRUCTIONS   Focus on a gluten-free diet. This diet must become a way of life.   Monitor your response to the gluten-free diet and treat any nutritional deficiencies.   Prepare ahead of time if you decide to eat outside the home.   Make and keep your regular follow-up visits with your caregiver.   Suggest to family members that they get screened for early signs of the disease.  SEEK MEDICAL CARE IF:   You continue to have digestive symptoms (gas, cramping, diarrhea) despite a proper diet.   You have trouble sticking to the gluten-free diet.   You develop an itchy rash with groups of tiny blisters.   You develop severe weakness, balance problems, menstrual problems, or depression.  Document Released: 06/18/2005 Document Revised: 06/07/2011 Document Reviewed: 10/05/2009 [facility] Patient Information 2012 [place], [place].

## 2011-11-20 ENCOUNTER — Encounter (HOSPITAL_COMMUNITY): Payer: Self-pay | Admitting: Gastroenterology

## 2012-02-12 ENCOUNTER — Other Ambulatory Visit: Payer: Self-pay | Admitting: Obstetrics and Gynecology

## 2012-03-07 IMAGING — CT CT ABD-PELV W/ CM
2 of 3 series · 16 of 46 positions shown, 18 images · IV contrast (omnipaque)
Comparison: 07/17/2010

CLINICAL DATA: Right lower quadrant abdominal pain.  Nausea.

CT ABDOMEN AND PELVIS WITH CONTRAST
TECHNIQUE: Multidetector CT imaging of the abdomen and pelvis was
performed following the standard protocol during bolus
administration of intravenous contrast.
Contrast:  100  ml Omnipaque 300 IV contrast

[Series 2: abd/pelvis 5.0 b31f · axial · 0.61mm/px · z∈[-203,+167]mm · 13 of 86 slices shown, 15 images]
[im 6/86  soft-tissue]
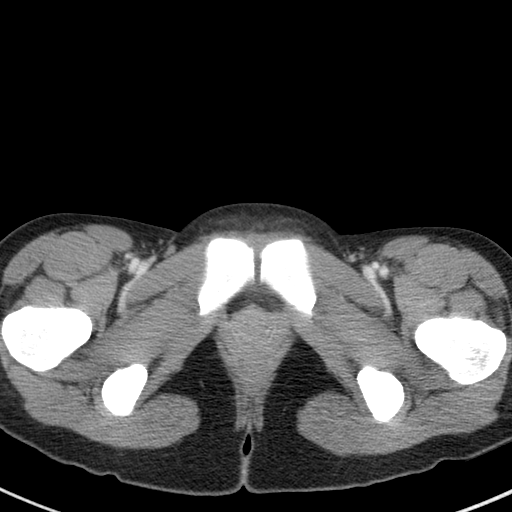
[im 6/86  bone]
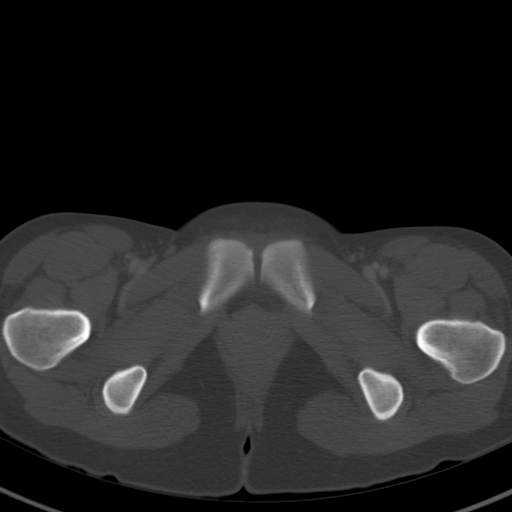
[im 11/86  soft-tissue]
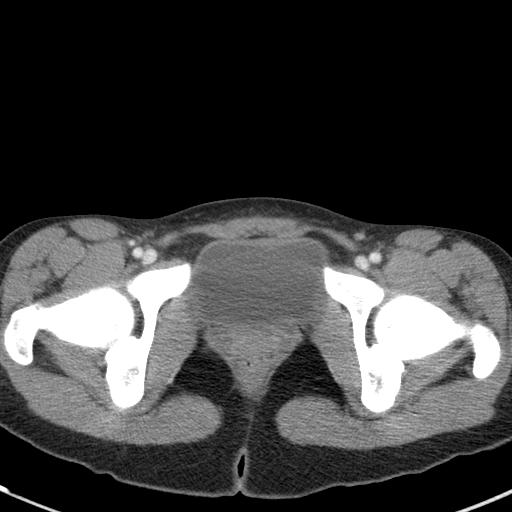
[im 17/86  soft-tissue]
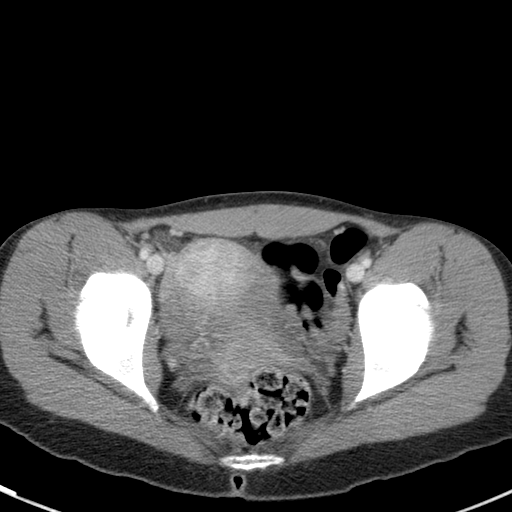
[im 25/86  soft-tissue]
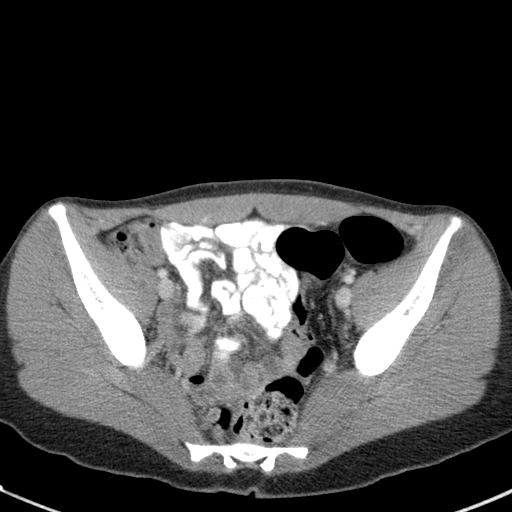
[im 31/86  soft-tissue]
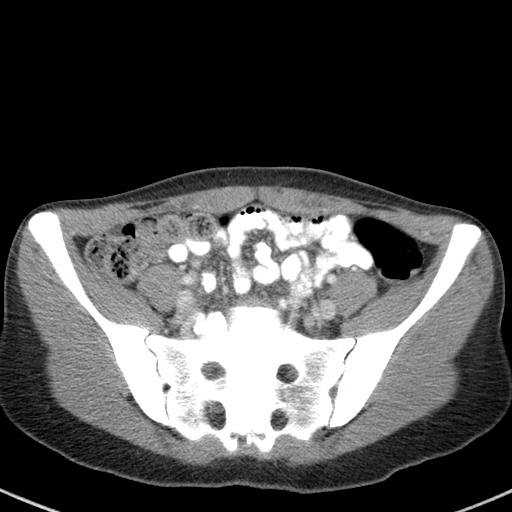
[im 36/86  soft-tissue]
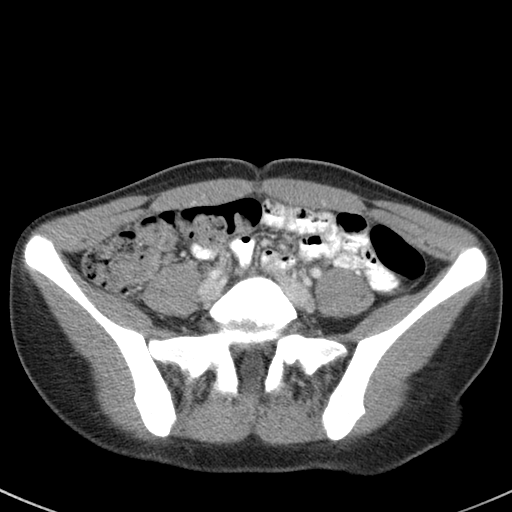
[im 44/86  soft-tissue]
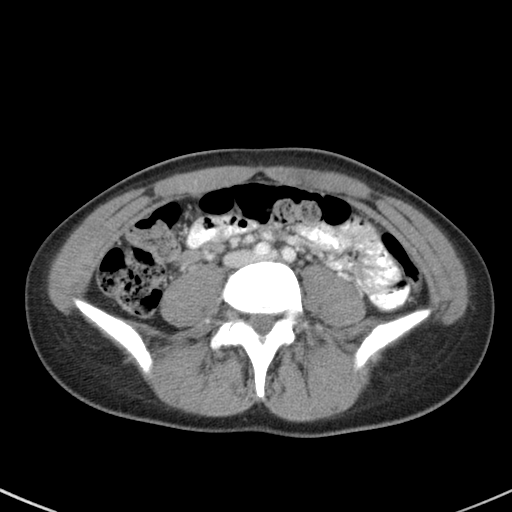
[im 50/86  soft-tissue]
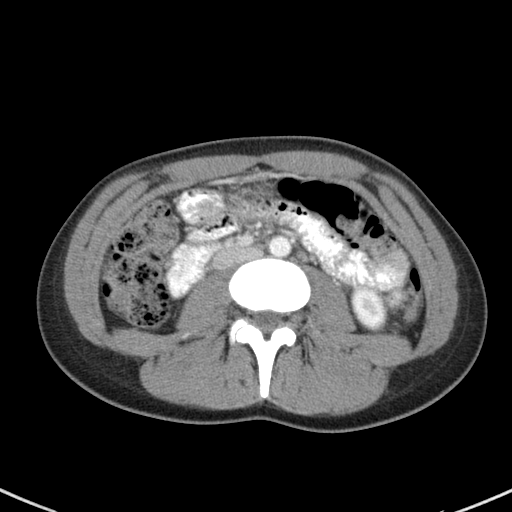
[im 55/86  soft-tissue]
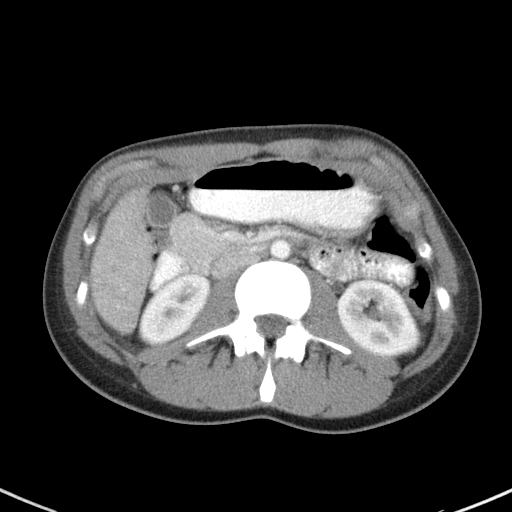
[im 55/86  bone]
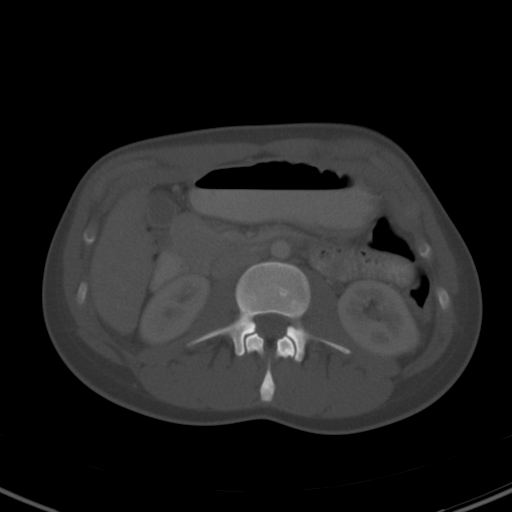
[im 61/86  soft-tissue]
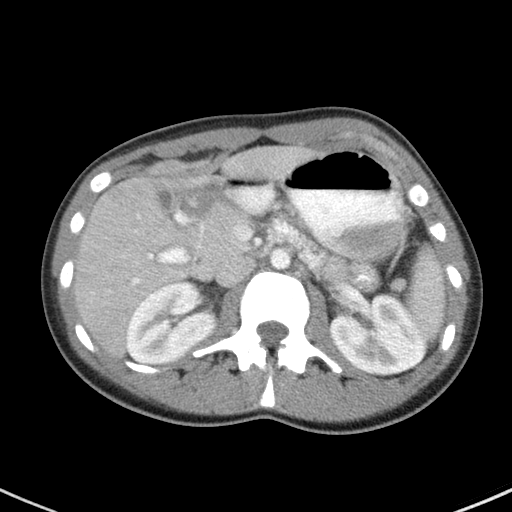
[im 69/86  soft-tissue]
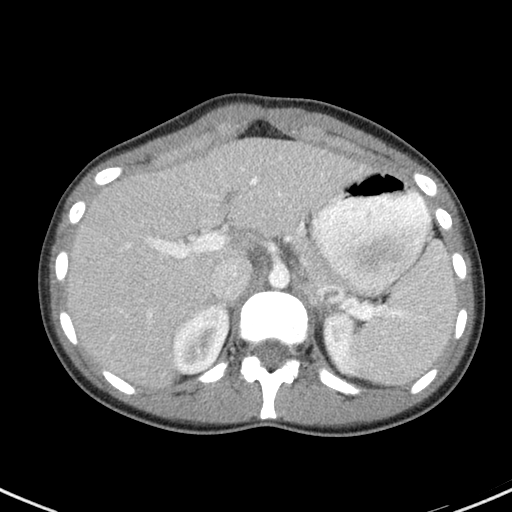
[im 75/86  soft-tissue]
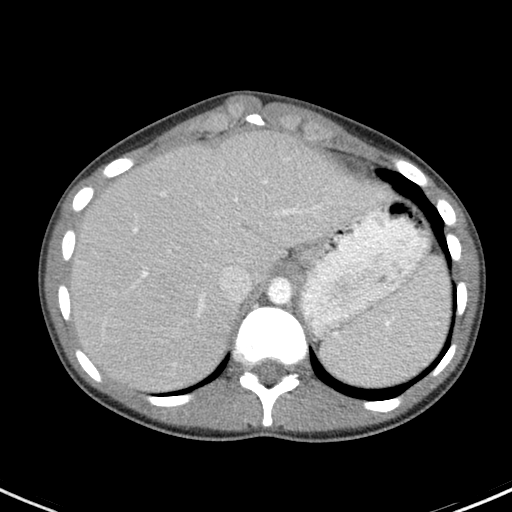
[im 80/86  soft-tissue]
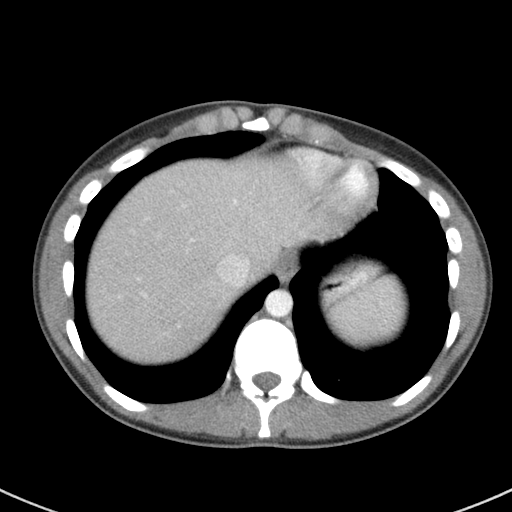

[Series 5: abd/pelvis 3.0 coronal · coronal · 0.58mm/px · 3 of 71 slices shown]
[im 24/71  soft-tissue]
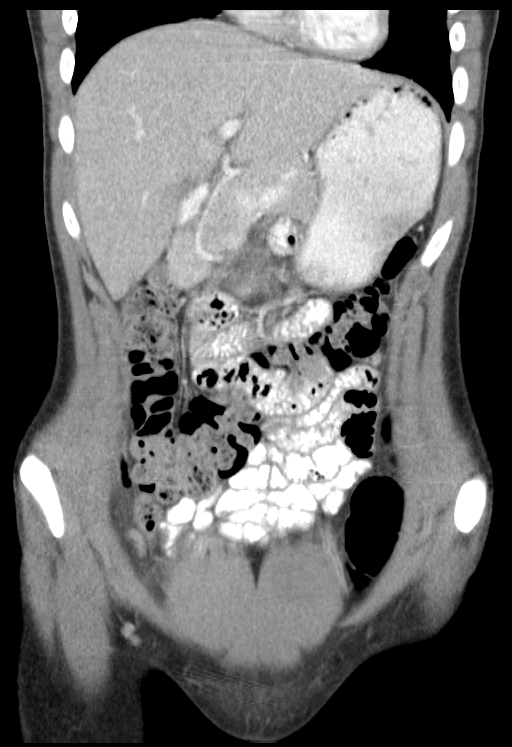
[im 32/71  soft-tissue]
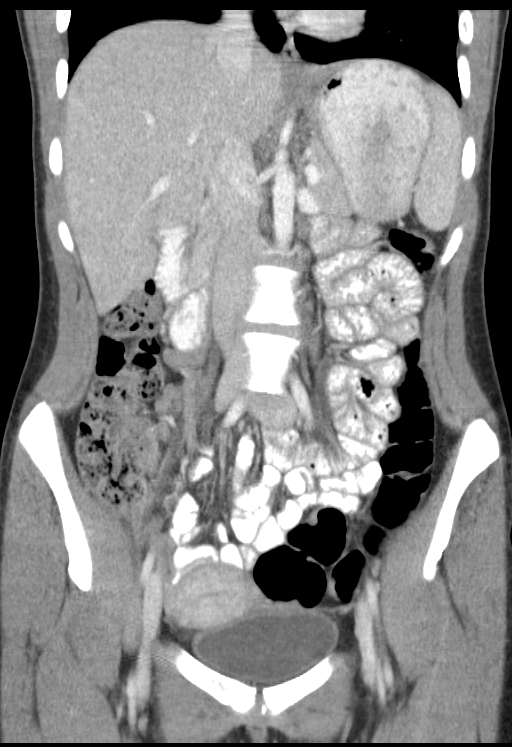
[im 39/71  soft-tissue]
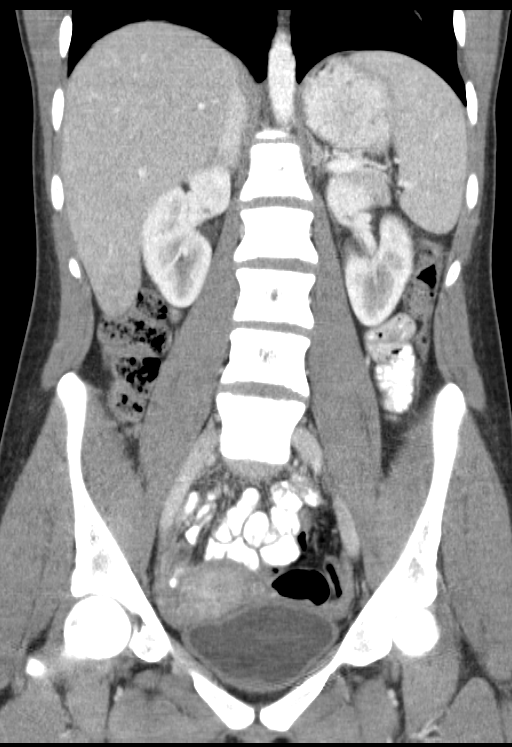

[16 of 46 positions shown; findings below may reference images not displayed]

FINDINGS: Lung bases are clear.  Liver, gallbladder, adrenal
glands, kidneys, spleen, and pancreas are normal.  No
lymphadenopathy.  No free air or free fluid.

Although the appendix is normal in caliber measuring 6 mm, there is
minimal indistinctness of the wall and minimal apparent haziness of
the surrounding fat.  No surrounding periappendiceal fluid
collection or gas is identified.  No bowel wall thickening or focal
segmental dilatation.  Uterus and ovaries are normal.  Trace pelvic
free fluid is present.  No acute osseous finding.
IMPRESSION: Normal caliber appendix but with minimal indistinctness of the wall
and surrounding haziness in the mesenteric fat, which could
indicate very early appendicitis in the appropriate clinical
context but is nonspecific.  No other acute intra-abdominal or
pelvic pathology.

## 2012-10-29 ENCOUNTER — Other Ambulatory Visit: Payer: Self-pay | Admitting: Obstetrics and Gynecology

## 2014-02-10 ENCOUNTER — Other Ambulatory Visit: Payer: Self-pay | Admitting: Obstetrics and Gynecology

## 2014-06-23 ENCOUNTER — Other Ambulatory Visit: Payer: Self-pay | Admitting: Obstetrics and Gynecology

## 2014-06-28 LAB — CYTOLOGY - PAP

## 2014-12-30 ENCOUNTER — Other Ambulatory Visit: Payer: Self-pay | Admitting: Obstetrics and Gynecology

## 2014-12-31 LAB — CYTOLOGY - PAP

## 2015-07-06 MED FILL — NITROFURANTOIN MONO-MCR 100: 100 | 7 days supply | Qty: 14 | Fill #0

## 2015-08-15 MED FILL — TRI-PREVIFEM TABLET: 0.18/0.215/ | 84 days supply | Qty: 84 | Fill #4

## 2015-08-15 MED FILL — LEVOTHYROXINE 175 MCG TAB: 175 | 30 days supply | Qty: 30 | Fill #1

## 2015-08-15 MED FILL — valACYclovir HCL 1 GM TABS: 1 | 90 days supply | Qty: 45 | Fill #2

## 2015-08-16 MED FILL — LEVOTHYROXINE 150 MCG TAB: 150 | 90 days supply | Qty: 24 | Fill #0 | Status: TO

## 2015-10-03 MED FILL — LEVOTHYROXINE 175 MCG TAB: 175 | 30 days supply | Qty: 30 | Fill #1

## 2015-11-16 MED FILL — valACYclovir HCL 1 GM TABS: 1 | 90 days supply | Qty: 45 | Fill #3

## 2015-11-16 MED FILL — LEVOTHYROXINE 175 MCG TAB: 175 | 30 days supply | Qty: 30 | Fill #2 | Status: TO

## 2015-11-16 MED FILL — TRI-PREVIFEM TABLET: 0.18/0.215/ | 84 days supply | Qty: 84 | Fill #5 | Status: TO

## 2015-11-16 MED FILL — LEVOTHYROXINE 150 MCG TAB: 150 | 90 days supply | Qty: 24 | Fill #1 | Status: TO

## 2016-08-06 DIAGNOSIS — M79604 Pain in right leg: Secondary | ICD-10-CM | POA: Diagnosis not present

## 2016-08-06 DIAGNOSIS — M79605 Pain in left leg: Secondary | ICD-10-CM | POA: Diagnosis not present

## 2016-08-06 DIAGNOSIS — I8311 Varicose veins of right lower extremity with inflammation: Secondary | ICD-10-CM | POA: Diagnosis not present

## 2016-09-03 DIAGNOSIS — M79604 Pain in right leg: Secondary | ICD-10-CM | POA: Diagnosis not present

## 2016-09-17 DIAGNOSIS — I8311 Varicose veins of right lower extremity with inflammation: Secondary | ICD-10-CM | POA: Diagnosis not present

## 2016-12-24 DIAGNOSIS — C73 Malignant neoplasm of thyroid gland: Secondary | ICD-10-CM | POA: Diagnosis not present

## 2016-12-24 DIAGNOSIS — E3122 Multiple endocrine neoplasia [MEN] type IIA: Secondary | ICD-10-CM | POA: Diagnosis not present

## 2016-12-24 DIAGNOSIS — E890 Postprocedural hypothyroidism: Secondary | ICD-10-CM | POA: Diagnosis not present

## 2017-01-24 DIAGNOSIS — E890 Postprocedural hypothyroidism: Secondary | ICD-10-CM | POA: Diagnosis not present

## 2017-01-24 DIAGNOSIS — E3122 Multiple endocrine neoplasia [MEN] type IIA: Secondary | ICD-10-CM | POA: Diagnosis not present

## 2017-01-24 DIAGNOSIS — C73 Malignant neoplasm of thyroid gland: Secondary | ICD-10-CM | POA: Diagnosis not present

## 2017-02-18 DIAGNOSIS — Z0100 Encounter for examination of eyes and vision without abnormal findings: Secondary | ICD-10-CM | POA: Diagnosis not present

## 2017-03-05 DIAGNOSIS — E890 Postprocedural hypothyroidism: Secondary | ICD-10-CM | POA: Diagnosis not present

## 2017-08-22 DIAGNOSIS — Z01419 Encounter for gynecological examination (general) (routine) without abnormal findings: Secondary | ICD-10-CM | POA: Diagnosis not present

## 2017-08-22 DIAGNOSIS — Z0142 Encounter for cervical smear to confirm findings of recent normal smear following initial abnormal smear: Secondary | ICD-10-CM | POA: Diagnosis not present

## 2018-03-06 DIAGNOSIS — E890 Postprocedural hypothyroidism: Secondary | ICD-10-CM | POA: Diagnosis not present

## 2018-03-06 DIAGNOSIS — Z23 Encounter for immunization: Secondary | ICD-10-CM | POA: Diagnosis not present

## 2018-03-06 DIAGNOSIS — E3122 Multiple endocrine neoplasia [MEN] type IIA: Secondary | ICD-10-CM | POA: Diagnosis not present

## 2018-03-06 DIAGNOSIS — C73 Malignant neoplasm of thyroid gland: Secondary | ICD-10-CM | POA: Diagnosis not present

## 2018-12-31 DIAGNOSIS — K219 Gastro-esophageal reflux disease without esophagitis: Secondary | ICD-10-CM | POA: Diagnosis not present

## 2019-02-25 DIAGNOSIS — K2950 Unspecified chronic gastritis without bleeding: Secondary | ICD-10-CM | POA: Diagnosis not present

## 2019-02-25 DIAGNOSIS — R1310 Dysphagia, unspecified: Secondary | ICD-10-CM | POA: Diagnosis not present

## 2019-07-31 ENCOUNTER — Other Ambulatory Visit: Payer: Self-pay | Admitting: Obstetrics and Gynecology

## 2019-07-31 DIAGNOSIS — N6452 Nipple discharge: Secondary | ICD-10-CM

## 2019-07-31 DIAGNOSIS — N644 Mastodynia: Secondary | ICD-10-CM

## 2019-08-11 ENCOUNTER — Ambulatory Visit
Admission: RE | Admit: 2019-08-11 | Discharge: 2019-08-11 | Disposition: A | Discharge status: Home / Self Care | Payer: 59 | Source: Ambulatory Visit | Attending: Obstetrics and Gynecology | Admitting: Obstetrics and Gynecology

## 2019-08-11 ENCOUNTER — Other Ambulatory Visit: Payer: Self-pay | Admitting: Obstetrics and Gynecology

## 2019-08-11 ENCOUNTER — Other Ambulatory Visit: Payer: Self-pay

## 2019-08-11 DIAGNOSIS — N644 Mastodynia: Secondary | ICD-10-CM

## 2019-08-11 DIAGNOSIS — N6452 Nipple discharge: Secondary | ICD-10-CM

## 2019-08-11 DIAGNOSIS — N6310 Unspecified lump in the right breast, unspecified quadrant: Secondary | ICD-10-CM

## 2019-08-12 ENCOUNTER — Ambulatory Visit
Admission: RE | Admit: 2019-08-12 | Discharge: 2019-08-12 | Disposition: A | Discharge status: Home / Self Care | Payer: BC Managed Care – PPO | Source: Ambulatory Visit | Attending: Obstetrics and Gynecology | Admitting: Obstetrics and Gynecology

## 2019-08-12 ENCOUNTER — Other Ambulatory Visit: Payer: Self-pay

## 2019-08-12 ENCOUNTER — Other Ambulatory Visit: Payer: Self-pay | Admitting: Obstetrics and Gynecology

## 2019-08-12 DIAGNOSIS — N6310 Unspecified lump in the right breast, unspecified quadrant: Secondary | ICD-10-CM

## 2019-08-12 HISTORY — PX: BREAST BIOPSY: SHX20

## 2019-08-13 ENCOUNTER — Other Ambulatory Visit: Payer: Self-pay | Admitting: Obstetrics and Gynecology

## 2019-08-13 DIAGNOSIS — N6452 Nipple discharge: Secondary | ICD-10-CM

## 2019-08-20 ENCOUNTER — Other Ambulatory Visit: Payer: BC Managed Care – PPO

## 2019-08-26 ENCOUNTER — Ambulatory Visit
Admission: RE | Admit: 2019-08-26 | Discharge: 2019-08-26 | Disposition: A | Discharge status: Home / Self Care | Payer: BC Managed Care – PPO | Source: Ambulatory Visit | Attending: Obstetrics and Gynecology | Admitting: Obstetrics and Gynecology

## 2019-08-26 ENCOUNTER — Other Ambulatory Visit: Payer: Self-pay | Admitting: Obstetrics and Gynecology

## 2019-08-26 ENCOUNTER — Other Ambulatory Visit: Payer: Self-pay

## 2019-08-26 DIAGNOSIS — N6452 Nipple discharge: Secondary | ICD-10-CM

## 2019-08-26 DIAGNOSIS — N6310 Unspecified lump in the right breast, unspecified quadrant: Secondary | ICD-10-CM

## 2019-09-09 ENCOUNTER — Other Ambulatory Visit: Payer: Self-pay | Admitting: Obstetrics and Gynecology

## 2019-09-09 ENCOUNTER — Ambulatory Visit
Admission: RE | Admit: 2019-09-09 | Discharge: 2019-09-09 | Disposition: A | Discharge status: Home / Self Care | Payer: BC Managed Care – PPO | Source: Ambulatory Visit | Attending: Obstetrics and Gynecology | Admitting: Obstetrics and Gynecology

## 2019-09-09 ENCOUNTER — Other Ambulatory Visit: Payer: Self-pay

## 2019-09-09 DIAGNOSIS — N6310 Unspecified lump in the right breast, unspecified quadrant: Secondary | ICD-10-CM

## 2019-10-21 ENCOUNTER — Other Ambulatory Visit: Payer: Self-pay | Admitting: Surgery

## 2019-10-21 DIAGNOSIS — N6452 Nipple discharge: Secondary | ICD-10-CM

## 2019-11-04 ENCOUNTER — Ambulatory Visit
Admission: RE | Admit: 2019-11-04 | Discharge: 2019-11-04 | Disposition: A | Discharge status: Home / Self Care | Payer: BLUE CROSS/BLUE SHIELD | Source: Ambulatory Visit | Attending: Surgery | Admitting: Surgery

## 2019-11-04 ENCOUNTER — Other Ambulatory Visit: Payer: Self-pay

## 2019-11-04 DIAGNOSIS — N6452 Nipple discharge: Secondary | ICD-10-CM

## 2019-11-04 MED ORDER — GADOBUTROL 1 MMOL/ML IV SOLN
6.0000 mL | Freq: Once | INTRAVENOUS | Status: AC | PRN
  Administered 2019-11-04: 10:00:00 6 mL via INTRAVENOUS

## 2019-11-05 ENCOUNTER — Telehealth: Payer: Self-pay | Admitting: Genetic Counselor

## 2019-11-05 NOTE — Telephone Encounter (Signed)
Received a genetic counseling referral from Dr. Brantley Stage for fhx of cancer. Pt has been cld and scheduled for a virtual visit on 5/11 at 11am with Raquel Sarna. Pt aware a link will be sent to her via text.

## 2019-11-09 ENCOUNTER — Other Ambulatory Visit: Payer: Self-pay | Admitting: Surgery

## 2019-11-09 DIAGNOSIS — R9389 Abnormal findings on diagnostic imaging of other specified body structures: Secondary | ICD-10-CM

## 2019-11-10 ENCOUNTER — Inpatient Hospital Stay: Payer: BLUE CROSS/BLUE SHIELD | Attending: Genetic Counselor | Admitting: Genetic Counselor

## 2019-11-10 ENCOUNTER — Encounter: Payer: Self-pay | Admitting: Genetic Counselor

## 2019-11-10 DIAGNOSIS — Z800 Family history of malignant neoplasm of digestive organs: Secondary | ICD-10-CM | POA: Diagnosis not present

## 2019-11-10 DIAGNOSIS — Z803 Family history of malignant neoplasm of breast: Secondary | ICD-10-CM | POA: Insufficient documentation

## 2019-11-10 DIAGNOSIS — Z808 Family history of malignant neoplasm of other organs or systems: Secondary | ICD-10-CM

## 2019-11-10 DIAGNOSIS — E3122 Multiple endocrine neoplasia [MEN] type IIA: Secondary | ICD-10-CM | POA: Diagnosis not present

## 2019-11-10 NOTE — Progress Notes (Signed)
REFERRING PROVIDER: Erroll Luna, MD [address] [place],  [place] [address]  PRIMARY PROVIDER:  Jacelyn Pi, MD  PRIMARY REASON FOR VISIT:  1. Family history of breast cancer   2. Multiple endocrine neoplasia type 2A (MEN2A) ([place])   3. Family history of thyroid cancer   4. Family history of stomach cancer     I connected with Nicole Farmer on 11/10/2019 at 11:00 am EDT by MyChart video conference and verified that I am speaking with the correct person using two identifiers.   Patient location: Home Provider location: [facility] office  HISTORY OF PRESENT ILLNESS:   Nicole Farmer, a 29 y.o. female, was seen for a Gleason cancer genetics consultation at the request of Dr. Brantley Stage due to a family history of breast cancer.  Nicole Farmer presents to clinic today to discuss the possibility of a hereditary predisposition to cancer, genetic testing, and to further clarify her future cancer risks, as well as potential cancer risks for family members.   In 2007, at the age of 65, Nicole Farmer had positive genetic testing for a known family history of Multiple Endocrine Neoplasia Type 2A (MEN2A). She subsequently underwent a prophylactic thyroidectomy on 01/08/06, which revealed early microscopic medullary thyroid cancer. She is currently followed by Dr. Chalmers Cater at [facility] for her diagnosis of MEN2A.    RISK FACTORS:  Menarche was at age 16.  No live births.  OCP use for approximately 13 years.  Ovaries intact: yes.  Hysterectomy: no.  Menopausal status: premenopausal.  HRT use: 0 years. Colonoscopy: n/a. Mammogram within the last year: yes. Number of breast biopsies: 2. Any excessive radiation exposure in the past: radioactive iodine treatment for her thyroid cancer   Past Medical History:  Diagnosis Date  . Cancer [facility])    thyroid s/p thyroidectomy  . Family history of breast cancer   . Family history of stomach cancer   . Family history of thyroid cancer   . GERD  (gastroesophageal reflux disease)   . Headache(784.0)   . Herpes infection    simplex 1  . Multiple endocrine neoplasia type 2A (MEN2A) ([place])   . PONV (postoperative nausea and vomiting)     Past Surgical History:  Procedure Laterality Date  . BREAST BIOPSY Right 08/12/2019   fibroadenoma  . ESOPHAGOGASTRODUODENOSCOPY  11/19/2011   Procedure: ESOPHAGOGASTRODUODENOSCOPY (EGD);  Surgeon: Juanita Craver, MD;  Location: [facility] END[facility]COPY;  Service: Endoscopy;  Laterality: N/A;  . SHOULDER SURGERY     RT  . THYROIDECTOMY      Social History   Socioeconomic History  . Marital status: Married    Spouse name: Not on file  . Number of children: Not on file  . Years of education: Not on file  . Highest education level: Not on file  Occupational History  . Not on file  Tobacco Use  . Smoking status: Never Smoker  Substance and Sexual Activity  . Alcohol use: Yes    Comment: social  . Drug use: No  . Sexual activity: Never    Birth control/protection: Pill  Other Topics Concern  . Not on file  Social History Narrative  . Not on file   Social Determinants of Health   Financial Resource Strain:   . Difficulty of Paying Living Expenses:   Food Insecurity:   . Worried About Charity fundraiser in the Last Year:   . Arboriculturist in the Last Year:   Transportation Needs:   .  Lack of Transportation (Medical):   Marland Kitchen Lack of Transportation (Non-Medical):   Physical Activity:   . Days of Exercise per Week:   . Minutes of Exercise per Session:   Stress:   . Feeling of Stress :   Social Connections:   . Frequency of Communication with Friends and Family:   . Frequency of Social Gatherings with Friends and Family:   . Attends Religious Services:   . Active Member of Clubs or Organizations:   . Attends Archivist Meetings:   Marland Kitchen Marital Status:      FAMILY HISTORY:  We obtained a detailed, 4-generation family history.  Significant diagnoses are listed below: Family History    Problem Relation Age of Onset  . Other Mother 75       high risk breast lesion  . Multiple endocrine neoplasia Mother        MEN2A  . Thyroid cancer Mother 35  . Breast cancer Maternal Grandmother 60  . Thyroid cancer Maternal Grandmother 61  . Multiple endocrine neoplasia Maternal Grandmother        MEN2A  . Stomach cancer Maternal Grandfather 72  . Multiple endocrine neoplasia Other        MEN2A (maternal great-aunt)  . Thyroid cancer Other 87   Nicole Farmer does not have children. She has a maternal half-brother who is 70 and has not had cancer. She believes her brother had negative genetic testing for MEN2A.   Nicole Farmer mother is 57 and has a history of MEN2A and medullary thyroid cancer diagnosed at age 55. She also has a history of a high risk breast lesion diagnosed at the age of 14 or 63. Nicole Farmer mother did not have any brothers or sisters. Her maternal grandmother is 30 and has a history of breast cancer diagnosed at the age of 52 and MEN2A and medullary thyroid cancer diagnosed at the age of 82. Nicole Farmer has a maternal great-aunt (grandmother's sister) with MEN2A and a history of medullary thyroid cancer, and this great-aunt also has a son who tested positive for MEN2A. Nicole Farmer maternal grandfather died at the age of 43 from stomach cancer, which had been diagnosed around the age of 47.   Nicole Farmer father is 74 and does not have a history of cancer. She has one paternal aunt and one paternal uncle, both are in their 27s or 67s and neither have had cancer. She has limited information about her paternal cousins. Her paternal grandmother died in her late 39s and her paternal grandfather died in his late 48s. There are no known diagnoses of cancer on the paternal side of the family.  Nicole Farmer is aware of previous family history of genetic testing for MEN2A in multiple maternal relatives. She does not know her ancestry. There is no reported Ashkenazi Jewish ancestry. There is no known  consanguinity.  GENETIC COUNSELING ASSESSMENT: Nicole Farmer is a 29 y.o. female with a family history of breast cancer and stomach cancer which is not suggestive of a hereditary cancer syndrome. Nicole Farmer also has a history of medullary thyroid cancer and genetic testing consistent with a diagnosis of Multiple Endocrine Neoplasia Type 2A (MEN2A). We, therefore, discussed and recommended the following at today's visit.   DISCUSSION:  We discussed that, in general, most cancer is not inherited in families, but instead is sporadic or familial. Sporadic cancers occur by chance and typically happen at older ages (>50 years) as this type of cancer is caused by genetic  changes acquired during an individual's lifetime. Approximately 5-10% of cancer is hereditary. Most cases of hereditary breast cancer are associated with the BRCA1 and BRCA2 genes, although there are other genes that can be associated with hereditary breast cancer syndromes. Identifying a hereditary cancer syndrome can be beneficial for several reasons, including knowing about other cancer risks, identifying potential screening and risk-reduction options that may be appropriate, and to understand if other family members could be at risk for cancer and allow them to undergo genetic testing.  We reviewed the characteristics, features and inheritance patterns of hereditary cancer syndromes. We discussed with Nicole Farmer that the family history of breast cancer does not meet insurance or NCCN criteria for genetic testing and, therefore, is not highly consistent with a familial hereditary cancer syndrome. We feel she is at low risk to harbor a gene mutation associated with such a condition. Thus, we did not recommend any genetic testing at this time, and recommended Nicole Farmer continue to follow the cancer screening guidelines given by her primary healthcare provider.  Based on Nicole Farmer's personal and family history, a statistical model Midwife) was used to  estimate her risk of developing breast cancer. This model estimates her lifetime risk of developing breast cancer to be approximately 21.6%. This estimation does not consider any genetic testing results. The patient's lifetime breast cancer risk is a preliminary estimate based on available information using one of several models endorsed by the [place] (ACS). The ACS recommends consideration of breast MRI screening as an adjunct to mammography for patients at high risk (defined as 20% or greater lifetime risk).    Nicole Farmer has been determined to be at high risk for breast cancer.  Therefore, we recommend that annual screening with mammography and breast MRI be performed, beginng at age 30. We discussed that Nicole Farmer should discuss her individual situation with her referring physician and/or gynecologist and determine a breast cancer screening plan with which they are both comfortable.    MEN2A:  Nicole Farmer also has a personal and family history of Multiple Endocrine Neoplasia Type 2A (MEN2A), which has been confirmed by genetic testing (records not available for review today). MEN2A is an autosomal dominant genetic condition caused by mutations in the RET gene. Approximately 95% of individuals with MEN2A develop medullary thyroid cancer (MTC). About 50% of individuals will develop pheochromocytoma, and 20-30% develop hyperparathyroidism. Dermatologic findings in some families may include pruritic cutaneous lichen amyloidosis on the upper portion of the back. Additionally, approximately 7% of patients with MEN2A have Hirschsprung disease (HD).   Knowing the specific gene mutation is important for individuals with MEN2A because disease expression and medical management recommendations vary depending on the mutation.  Medullary Thyroid Cancer: . For individuals with an identified germline RET pathogenic variant, prophylactic thyroidectomy is the primary preventative measure. According to the  American Thyroid Association Guidelines Task Force consensus statement, the age at which prophylactic thyroidectomy is performed can be guided by the codon position of the RET mutation. However, these guidelines continue to be modified as more data become available.  . Continued monitoring for residual or recurrent MTC is indicated after thyroidectomy, even if thyroidectomy is performed prior to biochemical evidence of disease. Screening includes annual measurement of serum calcitonin.  Pheochromocytoma: . Annual biochemical screening is recommended for individuals whose initial screening results are negative for pheochromocytoma, followed by MRI and/or CT if the biochemical results are abnormal. . Women with MEN2A should be screened for pheochromocytoma prior to a  planned pregnancy, or as early as possible during an unplanned pregnancy.   Parathyroid Adenoma or Hyperplasia: Annual biochemical screening is recommended for affected individuals who have not had parathyroidectomy and parathyroid autotransplantation.All individuals who have undergone thyroidectomy and autostransplantation of the parathyroid glands needs monitoring for possible hypoparathyroidism.  Ms. Bajaj currently sees an endocrinologist, Dr. Chalmers Cater at [facility], for her MEN2A follow-up care.   PLAN:  Ms. Whittingham did not pursue genetic testing at today's visit. We remain available to coordinate genetic testing at any time in the future. We, therefore, recommend Ms. Villamizar continue to follow the cancer screening guidelines given by her healthcare providers. She will plan to discuss high-risk breast cancer screening with her gynecologist, Dr. Julien Girt.  We will attempt to obtain a copy of Ms. Roundtree's previous genetic testing of the RET gene for confirmation of risks and medical recommendations.   Ms. Lovins questions were answered to her satisfaction today. Our contact information was provided should additional questions or  concerns arise. Thank you for the referral and allowing [place] to share in the care of your patient.   Clint Guy, [place], [facility] Licensed, Certified Dispensing [email or web address] Phone: [phone]  The patient was seen for a total of 40 minutes in face-to-face genetic counseling.  This patient was discussed with Drs. Magrinat, Lindi Adie and/or Burr Medico who agrees with the above.    _______________________________________________________________________ For Office Staff:  Number of people involved in session: 1 Was an Intern/ student involved with case: no

## 2019-11-24 ENCOUNTER — Ambulatory Visit
Admission: RE | Admit: 2019-11-24 | Discharge: 2019-11-24 | Disposition: A | Discharge status: Home / Self Care | Payer: BLUE CROSS/BLUE SHIELD | Source: Ambulatory Visit | Attending: Surgery | Admitting: Surgery

## 2019-11-24 ENCOUNTER — Other Ambulatory Visit: Payer: Self-pay

## 2019-11-24 DIAGNOSIS — R9389 Abnormal findings on diagnostic imaging of other specified body structures: Secondary | ICD-10-CM

## 2019-11-24 DIAGNOSIS — N6341 Unspecified lump in right breast, subareolar: Secondary | ICD-10-CM | POA: Diagnosis not present

## 2019-11-24 MED ORDER — GADOBUTROL 1 MMOL/ML IV SOLN
6.0000 mL | Freq: Once | INTRAVENOUS | Status: AC | PRN
  Administered 2019-11-24: 6 mL via INTRAVENOUS

## 2020-05-03 ENCOUNTER — Other Ambulatory Visit: Payer: Self-pay | Admitting: Surgery

## 2020-05-03 DIAGNOSIS — N6452 Nipple discharge: Secondary | ICD-10-CM

## 2020-05-18 ENCOUNTER — Other Ambulatory Visit: Payer: Self-pay

## 2020-05-18 ENCOUNTER — Ambulatory Visit
Admission: RE | Admit: 2020-05-18 | Discharge: 2020-05-18 | Disposition: A | Discharge status: Home / Self Care | Payer: BLUE CROSS/BLUE SHIELD | Source: Ambulatory Visit | Attending: Surgery | Admitting: Surgery

## 2020-05-18 DIAGNOSIS — N6452 Nipple discharge: Secondary | ICD-10-CM

## 2020-05-18 MED ORDER — GADOBUTROL 1 MMOL/ML IV SOLN
10.0000 mL | Freq: Once | INTRAVENOUS | Status: AC | PRN
  Administered 2020-05-18: 10 mL via INTRAVENOUS

## 2021-01-04 ENCOUNTER — Other Ambulatory Visit: Payer: Self-pay | Admitting: Surgery

## 2021-01-04 DIAGNOSIS — Z9189 Other specified personal risk factors, not elsewhere classified: Secondary | ICD-10-CM

## 2022-05-17 ENCOUNTER — Other Ambulatory Visit (HOSPITAL_COMMUNITY): Payer: Self-pay

## 2022-05-17 MED ORDER — LEVOTHYROXINE SODIUM 50 MCG PO TABS
50.0000 ug | ORAL_TABLET | Freq: Every morning | ORAL | 5 refills | Status: AC
  Filled 2022-05-17: qty 30, 30d supply, fill #0

## 2022-05-18 ENCOUNTER — Other Ambulatory Visit (HOSPITAL_COMMUNITY): Payer: Self-pay
